# Patient Record
Sex: Male | Born: 1935 | Race: White | Hispanic: No | Marital: Married | Smoking: Former smoker
Health system: Southern US, Community
[De-identification: ages and names within clinical notes are randomized; demographics above are authoritative.]

## PROBLEM LIST (undated history)

## (undated) DIAGNOSIS — I2510 Atherosclerotic heart disease of native coronary artery without angina pectoris: Secondary | ICD-10-CM

## (undated) DIAGNOSIS — I10 Essential (primary) hypertension: Secondary | ICD-10-CM

## (undated) DIAGNOSIS — C801 Malignant (primary) neoplasm, unspecified: Secondary | ICD-10-CM

## (undated) DIAGNOSIS — M48061 Spinal stenosis, lumbar region without neurogenic claudication: Secondary | ICD-10-CM

## (undated) DIAGNOSIS — R55 Syncope and collapse: Secondary | ICD-10-CM

## (undated) DIAGNOSIS — N200 Calculus of kidney: Secondary | ICD-10-CM

## (undated) DIAGNOSIS — M1990 Unspecified osteoarthritis, unspecified site: Secondary | ICD-10-CM

## (undated) HISTORY — DX: Atherosclerotic heart disease of native coronary artery without angina pectoris: I25.10

## (undated) HISTORY — DX: Spinal stenosis, lumbar region without neurogenic claudication: M48.061

## (undated) HISTORY — PX: BACK SURGERY: SHX140

## (undated) HISTORY — DX: Syncope and collapse: R55

## (undated) HISTORY — PX: CATARACT EXTRACTION: SUR2

## (undated) HISTORY — PX: CORONARY ARTERY BYPASS GRAFT: SHX141

---

## 2000-11-28 ENCOUNTER — Inpatient Hospital Stay (HOSPITAL_COMMUNITY): Admission: EM | Admit: 2000-11-28 | Discharge: 2000-12-01 | Payer: Self-pay | Admitting: *Deleted

## 2001-10-01 ENCOUNTER — Ambulatory Visit (HOSPITAL_COMMUNITY): Admission: RE | Admit: 2001-10-01 | Discharge: 2001-10-01 | Payer: Self-pay | Admitting: Cardiology

## 2001-10-01 ENCOUNTER — Encounter: Payer: Self-pay | Admitting: Cardiology

## 2001-10-01 ENCOUNTER — Ambulatory Visit (HOSPITAL_COMMUNITY): Admission: RE | Admit: 2001-10-01 | Discharge: 2001-10-01 | Payer: Self-pay | Admitting: Pulmonary Disease

## 2001-10-02 ENCOUNTER — Inpatient Hospital Stay (HOSPITAL_COMMUNITY): Admission: RE | Admit: 2001-10-02 | Discharge: 2001-10-13 | Payer: Self-pay | Admitting: Cardiology

## 2001-10-07 ENCOUNTER — Encounter: Payer: Self-pay | Admitting: Cardiology

## 2001-10-09 ENCOUNTER — Encounter: Payer: Self-pay | Admitting: Thoracic Surgery (Cardiothoracic Vascular Surgery)

## 2001-10-09 HISTORY — PX: OTHER SURGICAL HISTORY: SHX169

## 2001-10-10 ENCOUNTER — Encounter: Payer: Self-pay | Admitting: Thoracic Surgery (Cardiothoracic Vascular Surgery)

## 2001-10-11 ENCOUNTER — Encounter: Payer: Self-pay | Admitting: Thoracic Surgery (Cardiothoracic Vascular Surgery)

## 2001-10-26 ENCOUNTER — Ambulatory Visit (HOSPITAL_COMMUNITY): Admission: RE | Admit: 2001-10-26 | Discharge: 2001-10-26 | Payer: Self-pay | Admitting: Cardiology

## 2002-02-17 ENCOUNTER — Emergency Department (HOSPITAL_COMMUNITY): Admission: EM | Admit: 2002-02-17 | Discharge: 2002-02-17 | Payer: Self-pay | Admitting: Emergency Medicine

## 2002-02-17 ENCOUNTER — Encounter: Payer: Self-pay | Admitting: Emergency Medicine

## 2002-02-17 ENCOUNTER — Inpatient Hospital Stay (HOSPITAL_COMMUNITY): Admission: AD | Admit: 2002-02-17 | Discharge: 2002-02-21 | Payer: Self-pay | Admitting: Cardiology

## 2002-02-17 ENCOUNTER — Encounter: Payer: Self-pay | Admitting: Cardiology

## 2002-02-18 ENCOUNTER — Encounter: Payer: Self-pay | Admitting: Internal Medicine

## 2002-02-20 ENCOUNTER — Encounter: Payer: Self-pay | Admitting: Cardiology

## 2002-03-21 ENCOUNTER — Ambulatory Visit (HOSPITAL_COMMUNITY): Admission: RE | Admit: 2002-03-21 | Discharge: 2002-03-21 | Payer: Self-pay | Admitting: Internal Medicine

## 2002-04-02 ENCOUNTER — Inpatient Hospital Stay (HOSPITAL_COMMUNITY): Admission: AD | Admit: 2002-04-02 | Discharge: 2002-04-10 | Payer: Self-pay | Admitting: General Surgery

## 2002-04-02 ENCOUNTER — Encounter: Payer: Self-pay | Admitting: General Surgery

## 2002-04-03 ENCOUNTER — Encounter: Payer: Self-pay | Admitting: General Surgery

## 2002-04-03 HISTORY — PX: HEMICOLECTOMY: SHX854

## 2002-04-06 ENCOUNTER — Encounter: Payer: Self-pay | Admitting: General Surgery

## 2002-09-10 ENCOUNTER — Encounter (HOSPITAL_COMMUNITY): Admission: RE | Admit: 2002-09-10 | Discharge: 2002-10-10 | Payer: Self-pay | Admitting: Oncology

## 2002-09-10 ENCOUNTER — Encounter: Admission: RE | Admit: 2002-09-10 | Discharge: 2002-09-10 | Payer: Self-pay | Admitting: Oncology

## 2002-09-11 ENCOUNTER — Encounter (HOSPITAL_COMMUNITY): Payer: Self-pay | Admitting: Oncology

## 2002-09-23 ENCOUNTER — Other Ambulatory Visit: Admission: RE | Admit: 2002-09-23 | Discharge: 2002-09-23 | Payer: Self-pay | Admitting: Dermatology

## 2003-01-20 ENCOUNTER — Emergency Department (HOSPITAL_COMMUNITY): Admission: EM | Admit: 2003-01-20 | Discharge: 2003-01-20 | Payer: Self-pay | Admitting: Emergency Medicine

## 2003-01-20 ENCOUNTER — Encounter: Payer: Self-pay | Admitting: Emergency Medicine

## 2003-06-18 ENCOUNTER — Ambulatory Visit (HOSPITAL_COMMUNITY): Admission: RE | Admit: 2003-06-18 | Discharge: 2003-06-18 | Payer: Self-pay | Admitting: Internal Medicine

## 2003-07-04 ENCOUNTER — Emergency Department (HOSPITAL_COMMUNITY): Admission: EM | Admit: 2003-07-04 | Discharge: 2003-07-04 | Payer: Self-pay | Admitting: Emergency Medicine

## 2003-07-09 ENCOUNTER — Inpatient Hospital Stay (HOSPITAL_COMMUNITY): Admission: EM | Admit: 2003-07-09 | Discharge: 2003-07-12 | Payer: Self-pay | Admitting: Emergency Medicine

## 2003-09-09 ENCOUNTER — Ambulatory Visit (HOSPITAL_COMMUNITY): Admission: RE | Admit: 2003-09-09 | Discharge: 2003-09-09 | Payer: Self-pay | Admitting: Pulmonary Disease

## 2003-11-07 ENCOUNTER — Ambulatory Visit (HOSPITAL_COMMUNITY): Admission: RE | Admit: 2003-11-07 | Discharge: 2003-11-07 | Payer: Self-pay | Admitting: Oncology

## 2004-03-12 ENCOUNTER — Ambulatory Visit (HOSPITAL_COMMUNITY): Admission: RE | Admit: 2004-03-12 | Discharge: 2004-03-12 | Payer: Self-pay | Admitting: Orthopedic Surgery

## 2004-04-01 ENCOUNTER — Encounter: Admission: RE | Admit: 2004-04-01 | Discharge: 2004-04-01 | Payer: Self-pay | Admitting: Orthopedic Surgery

## 2004-04-27 ENCOUNTER — Encounter: Admission: RE | Admit: 2004-04-27 | Discharge: 2004-04-27 | Payer: Self-pay | Admitting: Orthopedic Surgery

## 2004-05-14 ENCOUNTER — Encounter: Admission: RE | Admit: 2004-05-14 | Discharge: 2004-05-14 | Payer: Self-pay | Admitting: Orthopedic Surgery

## 2004-07-07 ENCOUNTER — Ambulatory Visit: Payer: Self-pay | Admitting: *Deleted

## 2004-07-07 ENCOUNTER — Ambulatory Visit (HOSPITAL_COMMUNITY): Admission: RE | Admit: 2004-07-07 | Discharge: 2004-07-07 | Payer: Self-pay | Admitting: *Deleted

## 2004-08-09 ENCOUNTER — Inpatient Hospital Stay (HOSPITAL_COMMUNITY): Admission: RE | Admit: 2004-08-09 | Discharge: 2004-08-13 | Payer: Self-pay | Admitting: Neurosurgery

## 2004-09-23 ENCOUNTER — Encounter: Admission: RE | Admit: 2004-09-23 | Discharge: 2004-09-23 | Payer: Self-pay | Admitting: Neurosurgery

## 2004-11-23 ENCOUNTER — Encounter: Admission: RE | Admit: 2004-11-23 | Discharge: 2004-11-23 | Payer: Self-pay | Admitting: Neurosurgery

## 2005-03-22 ENCOUNTER — Ambulatory Visit (HOSPITAL_COMMUNITY): Admission: RE | Admit: 2005-03-22 | Discharge: 2005-03-22 | Payer: Self-pay | Admitting: Pulmonary Disease

## 2005-03-31 IMAGING — CR DG CHEST 1V PORT
1 series · 1 of 1 positions shown · non-contrast
Comparison: none

CLINICAL DATA: Shortness of breath, recent lumbar surgery. 
 PORTABLE CHEST ? SINGLE VIEW:
 An [facility] semierect portable film of the chest made 08/12/04 at 3293 hours is compared to a previous study of 08/06/04 and shows slight worsening of the right basilar atelectasis.  There is again noted the coronary artery bypass grafts and mild cardiomegaly.

[view not recorded]
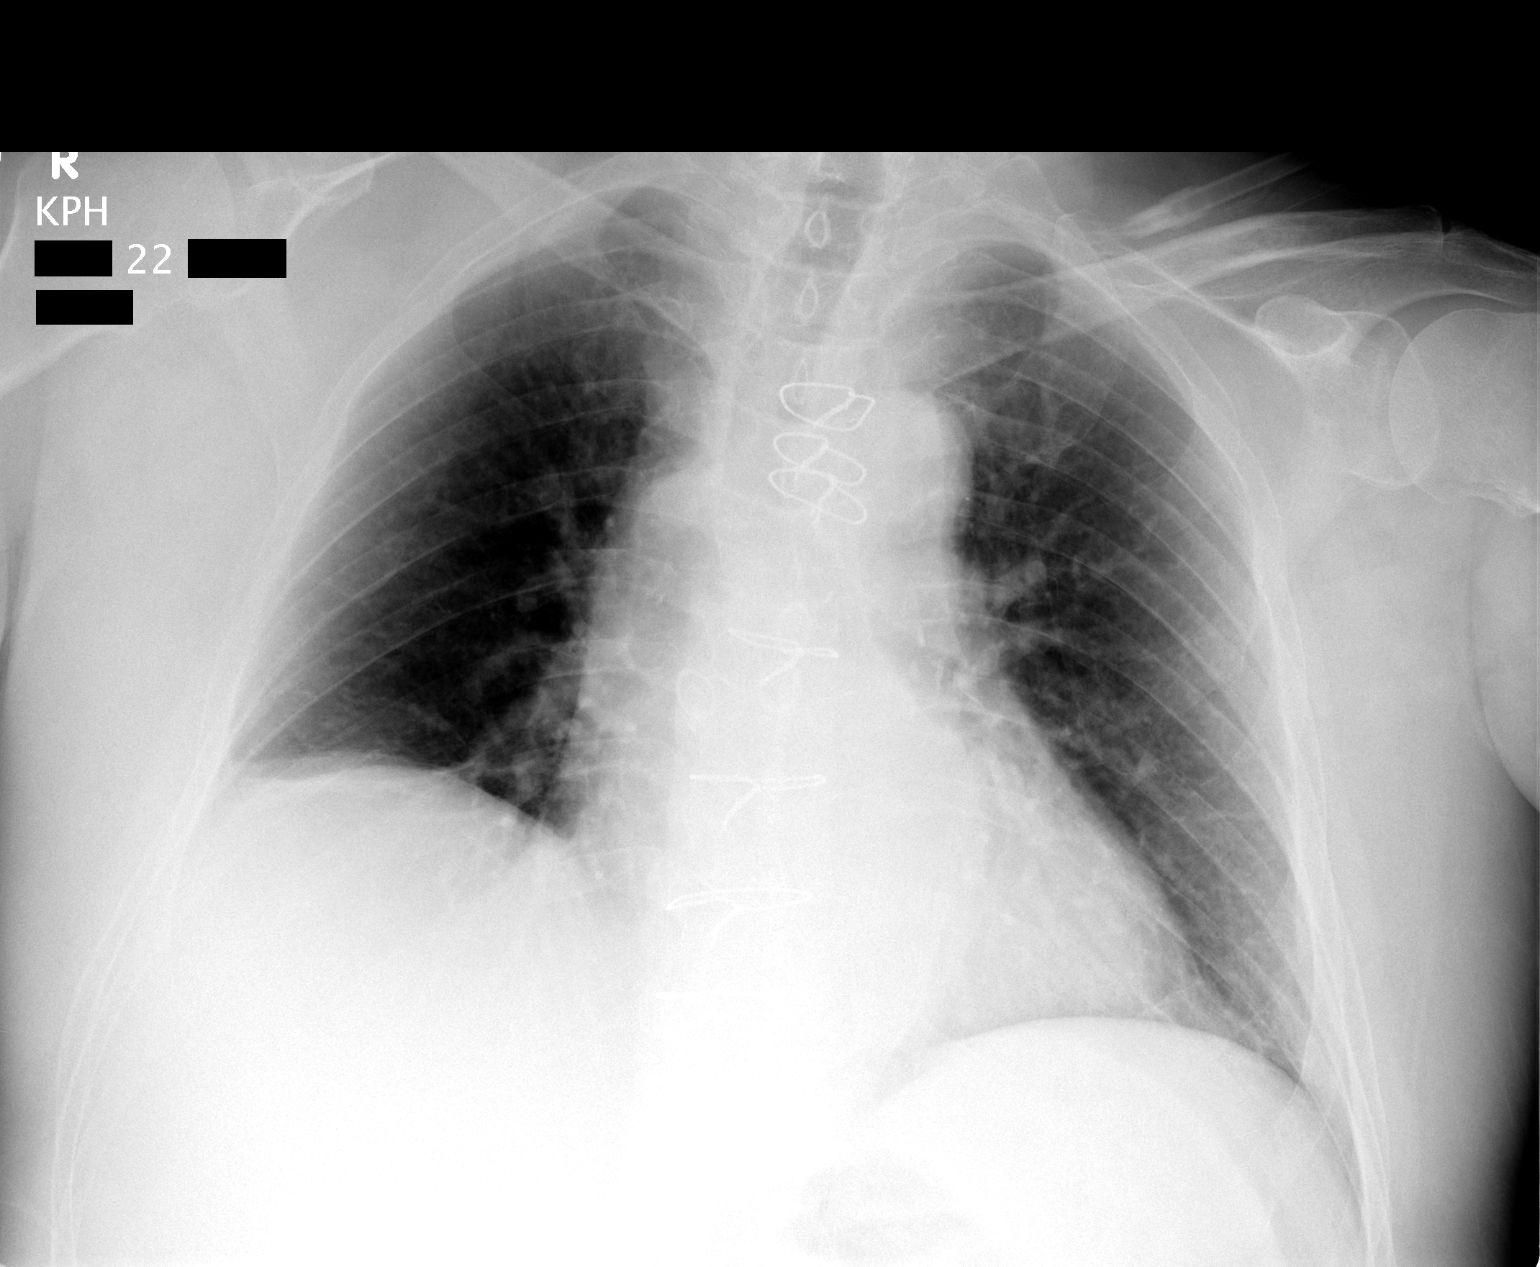

[1 of 1 positions shown; findings below may reference images not displayed]

IMPRESSION: Interval worsening right basilar atelectasis. Stable cardiomegaly, previous coronary artery bypass grafts.

## 2005-04-18 ENCOUNTER — Encounter: Admission: RE | Admit: 2005-04-18 | Discharge: 2005-05-03 | Payer: Self-pay | Admitting: Otolaryngology

## 2005-05-30 ENCOUNTER — Other Ambulatory Visit: Admission: RE | Admit: 2005-05-30 | Discharge: 2005-05-30 | Payer: Self-pay | Admitting: Dermatology

## 2005-05-31 ENCOUNTER — Encounter: Admission: RE | Admit: 2005-05-31 | Discharge: 2005-05-31 | Payer: Self-pay | Admitting: Neurosurgery

## 2006-02-14 ENCOUNTER — Ambulatory Visit: Payer: Self-pay | Admitting: Internal Medicine

## 2006-02-17 ENCOUNTER — Ambulatory Visit (HOSPITAL_COMMUNITY): Admission: RE | Admit: 2006-02-17 | Discharge: 2006-02-17 | Payer: Self-pay | Admitting: Internal Medicine

## 2006-02-17 ENCOUNTER — Ambulatory Visit: Payer: Self-pay | Admitting: Internal Medicine

## 2006-02-17 HISTORY — PX: ESOPHAGOGASTRODUODENOSCOPY: SHX1529

## 2006-03-17 ENCOUNTER — Emergency Department (HOSPITAL_COMMUNITY): Admission: EM | Admit: 2006-03-17 | Discharge: 2006-03-17 | Payer: Self-pay | Admitting: Emergency Medicine

## 2006-07-28 ENCOUNTER — Ambulatory Visit (HOSPITAL_COMMUNITY): Admission: RE | Admit: 2006-07-28 | Discharge: 2006-07-28 | Payer: Self-pay | Admitting: Urology

## 2006-11-03 IMAGING — CR DG CHEST 1V
1 series · 1 of 1 positions shown · non-contrast
Comparison: 08/12/04.

CLINICAL DATA: Chest pain and back pain.
 CHEST ? 1 VIEW ? 03/17/06 AT 4423 HOURS:

[view not recorded]
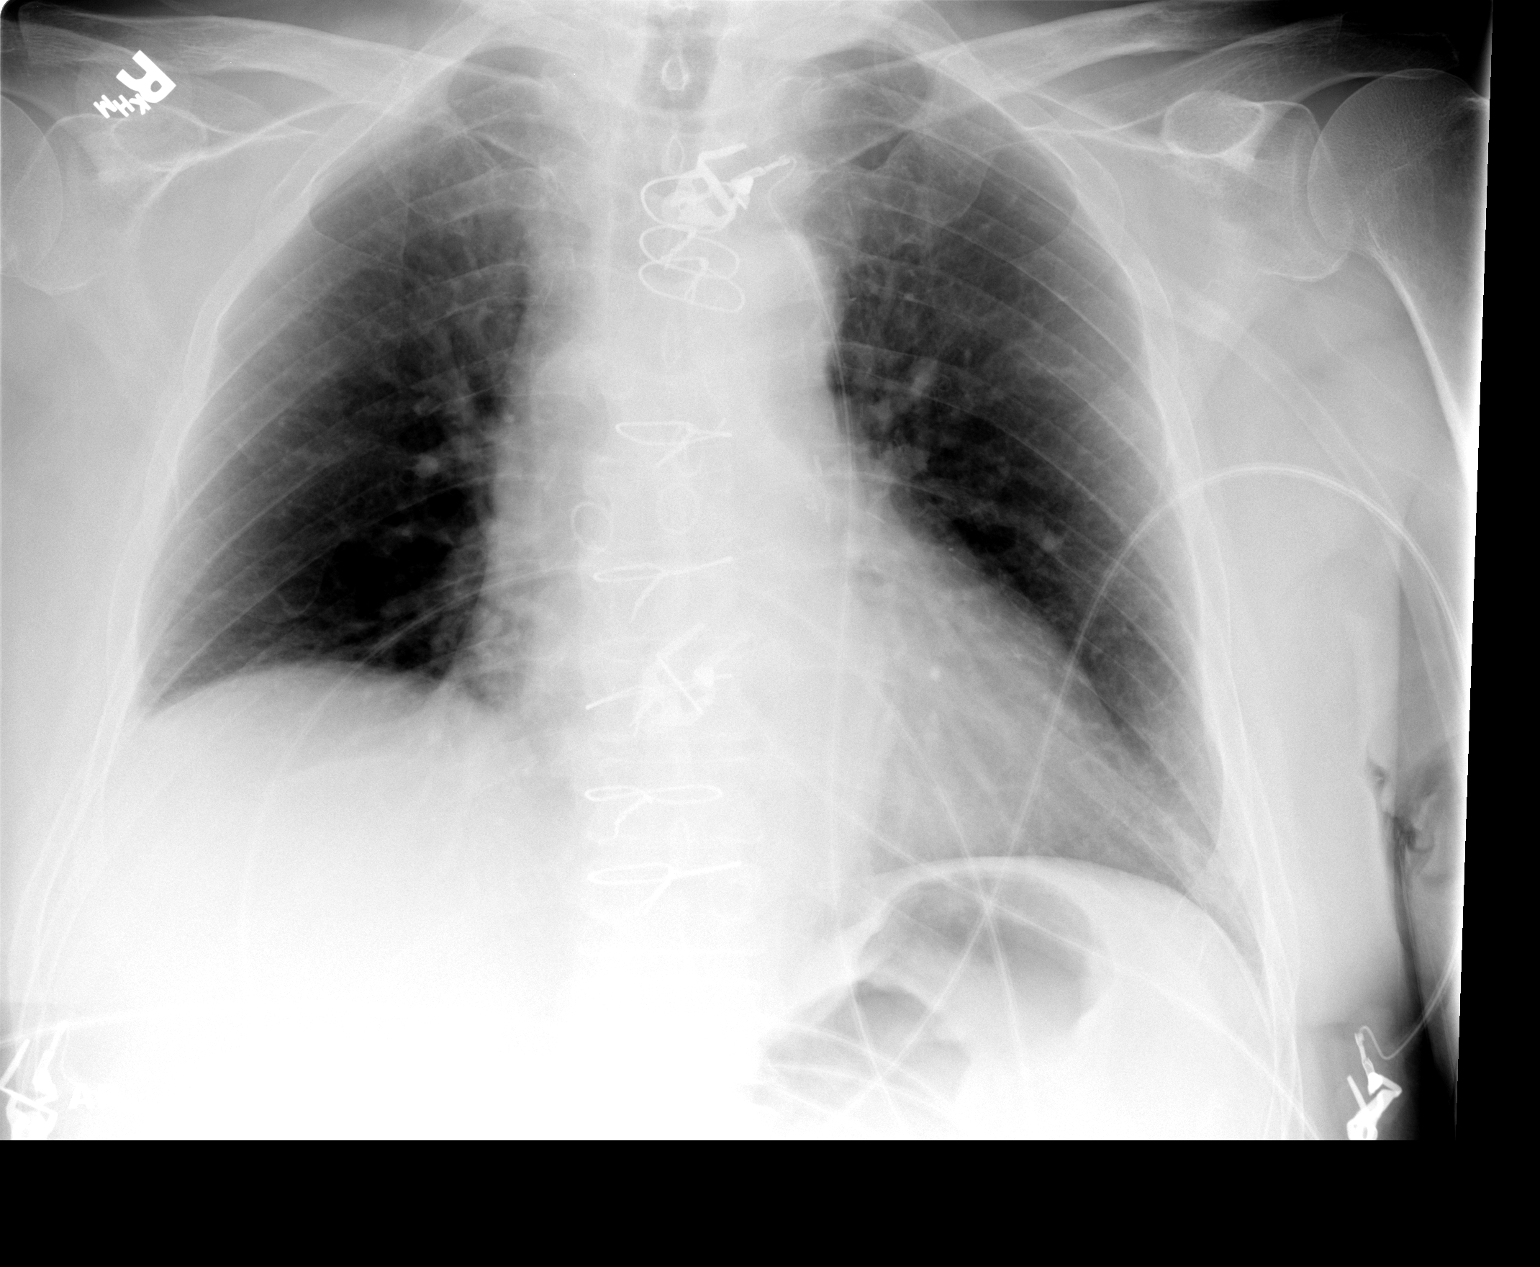

[1 of 1 positions shown; findings below may reference images not displayed]

FINDINGS: The heart is moderately enlarged.  Pulmonary vascularity is within normal limits.  The lungs are underinflated with bibasilar atelectasis.
IMPRESSION: Cardiomegaly without congestive heart failure.
 Bibasilar atelectasis.

## 2007-01-05 ENCOUNTER — Ambulatory Visit: Payer: Self-pay | Admitting: Cardiology

## 2007-01-18 ENCOUNTER — Ambulatory Visit: Payer: Self-pay

## 2007-02-09 ENCOUNTER — Ambulatory Visit: Payer: Self-pay | Admitting: Internal Medicine

## 2007-02-16 ENCOUNTER — Ambulatory Visit (HOSPITAL_COMMUNITY): Admission: RE | Admit: 2007-02-16 | Discharge: 2007-02-16 | Payer: Self-pay | Admitting: Internal Medicine

## 2007-03-01 ENCOUNTER — Ambulatory Visit (HOSPITAL_COMMUNITY): Admission: RE | Admit: 2007-03-01 | Discharge: 2007-03-01 | Payer: Self-pay | Admitting: Internal Medicine

## 2007-10-18 ENCOUNTER — Ambulatory Visit (HOSPITAL_COMMUNITY): Admission: RE | Admit: 2007-10-18 | Discharge: 2007-10-18 | Payer: Self-pay | Admitting: Neurosurgery

## 2008-01-05 ENCOUNTER — Emergency Department (HOSPITAL_COMMUNITY): Admission: EM | Admit: 2008-01-05 | Discharge: 2008-01-05 | Payer: Self-pay | Admitting: Emergency Medicine

## 2008-01-18 ENCOUNTER — Ambulatory Visit: Payer: Self-pay | Admitting: Cardiology

## 2008-07-04 ENCOUNTER — Emergency Department (HOSPITAL_COMMUNITY): Admission: EM | Admit: 2008-07-04 | Discharge: 2008-07-04 | Payer: Self-pay | Admitting: Emergency Medicine

## 2008-09-12 ENCOUNTER — Encounter
Admission: RE | Admit: 2008-09-12 | Discharge: 2008-09-12 | Payer: Self-pay | Admitting: Physical Medicine and Rehabilitation

## 2009-01-14 ENCOUNTER — Encounter (HOSPITAL_COMMUNITY)
Admission: RE | Admit: 2009-01-14 | Discharge: 2009-02-13 | Payer: Self-pay | Admitting: Physical Medicine and Rehabilitation

## 2009-01-29 DIAGNOSIS — M48061 Spinal stenosis, lumbar region without neurogenic claudication: Secondary | ICD-10-CM

## 2009-01-30 ENCOUNTER — Ambulatory Visit: Payer: Self-pay | Admitting: Cardiology

## 2009-07-29 ENCOUNTER — Ambulatory Visit (HOSPITAL_COMMUNITY): Admission: RE | Admit: 2009-07-29 | Discharge: 2009-07-29 | Payer: Self-pay | Admitting: Pulmonary Disease

## 2010-01-13 DIAGNOSIS — I2510 Atherosclerotic heart disease of native coronary artery without angina pectoris: Secondary | ICD-10-CM

## 2010-01-15 ENCOUNTER — Ambulatory Visit: Payer: Self-pay | Admitting: Cardiology

## 2010-01-19 ENCOUNTER — Encounter: Admission: RE | Admit: 2010-01-19 | Discharge: 2010-01-19 | Payer: Self-pay | Admitting: Neurosurgery

## 2010-09-21 NOTE — Assessment & Plan Note (Signed)
Summary: [name]   Visit Type:  1 yr f/u Primary Provider:  Kari Baars  CC:  chest pain.  History of Present Illness: [name] comes in today for evaluation and management of his coronary artery disease. He has had previous bypass surgery.  He's having no angina or ischemic symptoms. He denies orthopnea, palpitations his heart rate does increase when he gets excited. He is having a lot of problems with lower back and hips and is on a walker. He is not quite as humerus today.  He is compliant with his medications. Medications reviewed.  Current Medications (verified): 1)  Aspirin 81 Mg Tbec (Aspirin) .... Take One Tablet By Mouth Daily 2)  Crestor 10 Mg Tabs (Rosuvastatin Calcium) .Marland Kitchen.. 1 Tab At Bedtime 3)  Tricor 145 Mg Tabs (Fenofibrate) .Marland Kitchen.. 1 Tab Once Daily 4)  Multivitamins   Tabs (Multiple Vitamin) .Marland Kitchen.. 1 Tab Once Daily 5)  Sertraline Hcl 100 Mg Tabs (Sertraline Hcl) .Marland Kitchen.. 1 Tab Once Daily 6)  Xanax 0.5 Mg Tabs (Alprazolam) .... 3-4 Tabs Daily As Needed 7)  Metoprolol Tartrate 25 Mg Tabs (Metoprolol Tartrate) .Marland Kitchen.. 1 Tab Two Times A Day 8)  Lovaza 1 Gm Caps (Omega-3-Acid Ethyl Esters) .Marland Kitchen.. 1 Cap Four Times Daily 9)  Omeprazole 20 Mg Tbec (Omeprazole) .Marland Kitchen.. 1 Tab Once Daily 10)  Vitamin B-12 Injection .... Weekly 11)  Tramadol Hcl 50 Mg Tabs (Tramadol Hcl) .Marland Kitchen.. 1 Tab Four Times Daily 12)  Namenda 10 Mg Tabs (Memantine Hcl) .Marland Kitchen.. 1 Tab Two Times A Day 13)  Flomax 0.4 Mg Caps (Tamsulosin Hcl) .Marland Kitchen.. 1 Cap Once Daily  Allergies (verified): 1)  ! Morphine  Review of Systems       negative other than history of present illness  Vital Signs:  Patient profile:   77 year old male Height:      69 inches Weight:      200 pounds BMI:     29.64 Pulse rate:   66 / minute Pulse rhythm:   irregular BP sitting:   138 / 76  (left arm) Cuff size:   large  Vitals Entered By: Danielle Rankin, CMA (Jan 15, 2010 9:54 AM)  Physical Exam  General:  elderly, frail, in no acute  distress Head:  normocephalic and atraumatic Eyes:  PERRLA/EOM intact; conjunctiva and lids normal. Neck:  Neck supple, no JVD. No masses, thyromegaly or abnormal cervical nodes. Lungs:  Clear bilaterally to auscultation and percussion. Heart:  Non-displaced PMI, chest non-tender; regular rate and rhythm, S1, S2 without murmurs, rubs or gallops. Carotid upstroke normal, no bruit. Normal abdominal aortic size, no bruits. Femorals normal pulses, no bruits. Pedals normal pulses. No edema, no varicosities. Msk:  decreased ROM.   Pulses:  pulses normal in all 4 extremities Extremities:  trace left pedal edema and trace right pedal edema.   Neurologic:  Alert and oriented x 3. Skin:  Intact without lesions or rashes. Psych:  Normal affect.   EKG  Procedure date:  01/15/2010  Findings:      normal sinus rhythm first AV block. No acute change  Impression & Recommendations:  Problem # 1:  CAD, NATIVE VESSEL (ICD-414.01) Assessment Unchanged He is stable. Plan is medical therapy. We'll not repeat stress test. He is intolerant to adenosine. His updated medication list for this problem includes:    Aspirin 81 Mg Tbec (Aspirin) .Marland Kitchen... Take one tablet by mouth daily    Metoprolol Tartrate 25 Mg Tabs (Metoprolol tartrate) .Marland Kitchen... 1 tab two times a day  Orders: EKG w/ Interpretation (93000)  His updated medication list for this problem includes:    Aspirin 81 Mg Tbec (Aspirin) .Marland Kitchen... Take one tablet by mouth daily    Metoprolol Tartrate 25 Mg Tabs (Metoprolol tartrate) .Marland Kitchen... 1 tab two times a day  Patient Instructions: 1)  Your physician recommends that you schedule a follow-up appointment in: 1 YEAR WITH DR Corneshia Hines 2)  Your physician recommends that you continue on your current medications as directed. Please refer to the Current Medication list given to you today.

## 2010-11-05 ENCOUNTER — Emergency Department (HOSPITAL_COMMUNITY)
Admission: EM | Admit: 2010-11-05 | Discharge: 2010-11-05 | Disposition: A | Discharge status: Home / Self Care | Payer: Medicare Other | Attending: Emergency Medicine | Admitting: Emergency Medicine

## 2010-11-05 ENCOUNTER — Emergency Department (HOSPITAL_COMMUNITY): Payer: Medicare Other

## 2010-11-05 ENCOUNTER — Encounter: Payer: Self-pay | Admitting: Orthopedic Surgery

## 2010-11-05 DIAGNOSIS — Y92009 Unspecified place in unspecified non-institutional (private) residence as the place of occurrence of the external cause: Secondary | ICD-10-CM | POA: Insufficient documentation

## 2010-11-05 DIAGNOSIS — S61209A Unspecified open wound of unspecified finger without damage to nail, initial encounter: Secondary | ICD-10-CM | POA: Insufficient documentation

## 2010-11-05 DIAGNOSIS — E7800 Pure hypercholesterolemia, unspecified: Secondary | ICD-10-CM | POA: Insufficient documentation

## 2010-11-05 DIAGNOSIS — I252 Old myocardial infarction: Secondary | ICD-10-CM | POA: Insufficient documentation

## 2010-11-05 DIAGNOSIS — Z79899 Other long term (current) drug therapy: Secondary | ICD-10-CM | POA: Insufficient documentation

## 2010-11-05 DIAGNOSIS — W298XXA Contact with other powered powered hand tools and household machinery, initial encounter: Secondary | ICD-10-CM | POA: Insufficient documentation

## 2010-11-05 DIAGNOSIS — I10 Essential (primary) hypertension: Secondary | ICD-10-CM | POA: Insufficient documentation

## 2010-11-08 ENCOUNTER — Ambulatory Visit (INDEPENDENT_AMBULATORY_CARE_PROVIDER_SITE_OTHER): Payer: Medicare Other | Admitting: Orthopedic Surgery

## 2010-11-08 ENCOUNTER — Encounter: Payer: Self-pay | Admitting: Orthopedic Surgery

## 2010-11-08 DIAGNOSIS — S61209A Unspecified open wound of unspecified finger without damage to nail, initial encounter: Secondary | ICD-10-CM

## 2010-11-09 ENCOUNTER — Telehealth: Payer: Self-pay | Admitting: Orthopedic Surgery

## 2010-11-10 NOTE — Telephone Encounter (Signed)
Ok   zeroform and gauze daily until healed

## 2010-11-10 NOTE — Telephone Encounter (Signed)
Called Edwina/[facility] and advised of doctor's reply

## 2010-11-17 ENCOUNTER — Telehealth: Payer: Self-pay | Admitting: Orthopedic Surgery

## 2010-11-17 NOTE — Telephone Encounter (Signed)
Donnelle Halbig's daughter, Audie Clear left a message to cancel Serge's appointment for 11/22/10 for a recheck on his finger.  Said the home health nurse said it has healed good and he does not need to keep this appointment.

## 2010-11-18 NOTE — Assessment & Plan Note (Signed)
Summary: [facility] LACERATION RT INDEX & MIDDLE FINGER/MCR/,MUT OF OMA/BSF   Visit Type:  new patient Referring Provider:  [facility] Primary Provider:  Kari Baars  CC:  laceration to right hand.  History of Present Illness: I saw Devin Warner in the office today for an initial visit.  He is a 77 years old man with the complaint of:  Laceration to right index finger and right middle finger.  DOI 11-05-10.  Xrays were taken at [facility].  Medications to scan in chart.   RIGHT long and index finger have been injured, the index finger at the tip in the RIGHT long finger across the tip of the finger across the DIP joint crease and part of the middle phalanx. Skin and superficial abrasion type injury from a saw  Allergies: 1)  ! Morphine  Review of Systems Constitutional:  Denies weight loss, weight gain, fever, chills, and fatigue. Cardiovascular:  Denies chest pain, palpitations, fainting, and murmurs. Respiratory:  Denies short of breath, wheezing, couch, tightness, pain on inspiration, and snoring . Gastrointestinal:  Denies heartburn, nausea, vomiting, diarrhea, constipation, and blood in your stools. Genitourinary:  Denies frequency, urgency, difficulty urinating, painful urination, flank pain, and bleeding in urine. Neurologic:  Denies numbness, tingling, unsteady gait, dizziness, tremors, and seizure. Musculoskeletal:  Denies joint pain, swelling, instability, stiffness, redness, heat, and muscle pain. Endocrine:  Denies excessive thirst, exessive urination, and heat or cold intolerance. Psychiatric:  Complains of depression; denies nervousness, anxiety, and hallucinations. Skin:  Complains of redness; denies changes in the skin, poor healing, rash, and itching. HEENT:  Denies blurred or double vision, eye pain, redness, and watering. Immunology:  Denies seasonal allergies, sinus problems, and allergic to bee stings. Hemoatologic:  Denies easy bleeding and brusing.  Physical  Exam  Additional Exam:  his overall appearance is normal. He is oriented x3. His mood and affect are normal. He ambulates with assistive device, cane, well.  His RIGHT index finger and long finger have been injured. The superficial portion of the skin epidermal layer has been degloved from the tip of the fe sggle the end of his fingertip and his FDP and FDS appeared to be intact, but will need a reevaluation. Joints are stable skin as stated. Perfusion is excellent. Sensation is normal.  The index finger, distal tip, including portion of the nail has been removed by the saw as well.   Impression & Recommendations:  Problem # 1:  WOUND, FINGER (ICD-883.0) Assessment New  Orders: Wound Care Center Referral (Wound Care) New Patient Level II (54098)  Patient Instructions: 1)  Keep clean and dry 2)  go to [facility] for wound care every other day for the fingers 3)  come back 11/18/10 end of day appt   Orders Added: 1)  Wound Care Center Referral [Wound Care] 2)  New Patient Level II [11914]

## 2010-11-18 NOTE — Letter (Signed)
Summary: History form  History form   Imported By: Jacklynn Ganong 11/09/2010 12:36:50  _____________________________________________________________________  External Attachment:    Type:   Image     Comment:   External Document

## 2010-11-22 ENCOUNTER — Ambulatory Visit: Payer: Medicare Other | Admitting: Orthopedic Surgery

## 2011-01-04 NOTE — Assessment & Plan Note (Signed)
[facility]                            CARDIOLOGY OFFICE NOTE   NAME:Lienhard, SUMEET GETER                        MRN:          [id number]  DATE:01/05/2007                            DOB:          1926-09-15    Mr. Javier Gell is a delightful, humorous, 77 year old gentleman who  comes with his daughter today for further evaluation of chest discomfort  and previous coronary bypass surgery.   The last time I saw Mr. Knittle was in 2004.   He last saw Dr. Dionicio Stall in November 2005. He had had an adenosine  stress Myoview or Cardiolite in 2005 which showed no ischemia and normal  ejection fraction, he had an adverse reaction to adenosine which felt  like it was going to kill him and hurting in his chest even after he  went home. He does not want this again. Unfortunately he is unable to  ambulate.   He had coronary bypass surgery in 2003. He has normal left ventricular  function.   His other problems include hypertension and hyperlipidemia. He is  followed by Dr. Juanetta Gosling.   PAST MEDICAL HISTORY:  He is intolerant of MORPHINE. He does not do well  with adenosine as mentioned above.   CURRENT MEDICATIONS:  1. Avapro 150 mg a day.  2. AcipHex 20 mg a day.  3. Aspirin 81 mg a day.  4. Crestor 5 mg a day.  5. Tricor 160 mg a day.  6. Toprol XL 75 mg a day.  7. Xanax 5 mg a day.  8. Multivitamin daily.   PAST SURGICAL HISTORY:  He had back surgery in 1979 and 2005, had  partial colectomy in 2003 for cancer. He has had a cholecystectomy in  1983.   FAMILY HISTORY:  Remarkable for heart attacks and strokes but both his  father and his sister were above 20.   SOCIAL HISTORY:  He is retired and disabled. He is married and has 3  children.   REVIEW OF SYSTEMS:  Other than the HPI is remarkable for some shortness  of breath and dyspnea on exertion. He was a former smoker, quit in 1978.  He also gives a history of trouble swallowing sometimes with  pressure in  his chest. He said he has had his esophagus looked at several times. Dr.  Karilyn Cota in [place] is his gastroenterologist. He has also had some  urinary issues, arthritis and a history of cancer.   PHYSICAL EXAMINATION:  VITAL SIGNS:  His blood pressure is 114/72 in the  left arm, 115/70 in the right arm, pulse 66 and regular.  GENERAL:  He is 5 foot 9 and weighs 214 pounds.  VITAL SIGNS:  Heart rate is 66 and he is in sinus rhythm. His  electrocardiogram shows a first degree AV block, no ST segment changes.  HEENT:  Normocephalic, atraumatic. PERRLA. Extraocular movements intact.  Sclera clear. Facial symmetry is normal.  NECK:  Supple. Carotid upstrokes are equal bilaterally without bruits.  There is no JVD. Thyroid is not enlarged, trachea is midline.  LUNGS:  Clear.  HEART:  Reveals a poorly appreciated PMI. He has normal S1, S2 without  murmurs, rubs or gallops.  ABDOMEN:  Protuberant with good bowel sounds. He has a midline scar.  There is no tenderness.  EXTREMITIES:  Reveal trace edema. Pulses are intact. He had some  varicose veins with some palpable valves. There is no sign of DVT.  NEUROLOGIC:  Grossly intact. He walks with a cane.   ASSESSMENT/PLAN:  Mr. Retz is status post coronary blood pressure  surgery. He is over 3 years out from an objective assessment of his  coronary perfusion. His chest discomfort is a little bit atypical for  ischemic pain, in fact it sounds like more like it is esophageal.   PLAN:  A dobutamine rest stress Myoview here in [place]. He and I  feel more comfortable it was done here. If this is negative for  ischemia, I would recommend a GI followup with Dr. Karilyn Cota. I will see  him back in a year.     Thomas C. Daleen Squibb, MD, [facility]  Electronically Signed    TCW/MedQ  DD: 01/05/2007  DT: 01/05/2007  Job #: [id number]   cc:   Ramon Dredge L. Juanetta Gosling, M.D.

## 2011-01-04 NOTE — Assessment & Plan Note (Signed)
NAME:  Devin Warner, Devin Warner                  CHART#:  [id number]   DATE:  02/09/2007                       DOB:  08-Jul-1927   CHIEF COMPLAINT:  Dysphagia.   SUBJECTIVE:  The patient is a 77 year old, Caucasian male, who has a  history of recurrent dysphagia.  He last underwent an EGD by Dr. Karilyn Cota  on February 17, 2006.  He was found to have a normal EGD, a tiny  hyperplastic polyp in the stomach, which was left alone.  The esophagus  was empirically dilated with a #56-French Maloney dilator.  He also had  a sigmoid colon diverticulosis, and an otherwise normal colonoscopy with  a patent ileocolonic anastomosis.  He is status post right hemicolectomy  for colon carcinoma in August of 2003.  He tells me he is on Aciphex 20  mg twice daily.  He does continue to have breakthrough of his heartburn  and indigestion as well as severe water brash.  His weight has remained  stable.  He complains of a left anterior chest pain with swallowing.  He  was seen by a cardiologist and had a stress test and cardiac workup,  which were normal.  He tells me he has to swallow three to four times to  get his pills down, feels like things get stuck in his mid esophagus.  He has to take small sips of liquids or otherwise he gets choked up.  He  does complain of occasional odynophagia and a squeezing-type sensation  in his throat. Denies any regurgitation and denies any anorexia or early  satiety.   CURRENT MEDICATIONS:  See the list from February 09, 2007.   ALLERGIES:  MORPHINE.   OBJECTIVE:  VITAL SIGNS:  Weight 215.5 pounds, height 69 inches, temp  97.6, blood pressure 110/70, and pulse 72.  GENERAL:  The patient is a well-developed, well-nourished, elderly,  Caucasian male in no acute distress.  HEENT:  Sclerae are clear and nonicteric, conjunctivae pink.  Oropharynx  pink and moist without any lesions.  NECK:  Supple without any thyromegaly.  CHEST:  Heart regular rate and rhythm, normal S1 and S2, without  murmurs, clicks, rubs, or gallops.  LUNGS:  Clear to auscultation bilaterally with decreased breath sounds  bilaterally, no adventitious changes.  ABDOMEN:  Positive bowel sounds x4, no bruits auscultated, soft,  nontender, nondistended, without palpable mass or hepatosplenomegaly,  rebound tenderness or guarding.  EXTREMITIES:  He does have clubbing, he also has dried yellow nails with  chipping   ASSESSMENT:  The patient is a 77 year old, Caucasian male with recurrent  dysphagia despite at least two normal esophagogastroduodenoscopies.  I  suspect we could be dealing with oropharyngeal dysphagia.  He has also  been noticing chest pain with swallowing and some refractory  gastroesophageal reflux disease-type symptoms despite being on a daily  proton pump inhibitor.  I feel we should pursue a barium pill esophagram  at this point to rule out oropharyngeal dysphagia, achalasia, etc.   PLAN:  1. Barium pill esophagram.  2. He is to continue Aciphex 20 mg b.i.d. for now.  3. Further recommendations to follow, and we may need to pursue EGD      with esophageal dilatation.  He is wanting to follow up with Dr.      Jena Gauss in Dr. Patty Sermons absence.  Lorenza Burton, N.P.  Electronically Signed     R. Roetta Sessions, M.D.  Electronically Signed    KJ/MEDQ  D:  02/09/2007  T:  02/09/2007  Job:  [id number]   cc:   Ramon Dredge L. Juanetta Gosling, M.D.

## 2011-01-04 NOTE — H&P (Signed)
NAME:  Devin Warner, Devin Warner NO.:  [id number]   MEDICAL RECORD NO.:  [id number]          PATIENT TYPE:  EMS   LOCATION:  ED                            FACILITY:  [facility]   PHYSICIAN:  Mila Homer. Sudie Bailey, M.D.DATE OF BIRTH:  04-12-1927   DATE OF ADMISSION:  01/05/2008  DATE OF DISCHARGE:  05/16/2009LH                              HISTORY & PHYSICAL   This 77 year old was seen in the emergency room yesterday.  He had been  out in the heat and gotten out of the car and then got very dizzy, when  did so, he did not really remember anything.  Luckily, his daughter was  right next to him and was able to hold on to him until he recovered from  this.  He then came to the emergency room.   He has a history of being on many medications and being on narcotics.  Current meds are;  1. Avapro 150 mg daily.  2. Aciphex 20 mg b.i.d.  3. Aspirin 81 mg daily.  4. Crestor 5 mg daily.  5. Tricor 160 mg daily.  6. Toprol-XL 75 mg daily.  7. Xanax 0.5 mg as needed.   OBJECTIVE:  Time I saw him, he was oriented and alert.  Color was good.  He was somewhat recumbent in bed in the ER.  He was a good historian.  Daughter was with him to help fill the history.   His vital signs were stable.  His heart had a regular rhythm with rate  of 80.  The lungs were clear throughout, moving air well, but decreased  breath sounds.  The abdomen was soft without organomegaly or masses.  There was no edema of the ankles.   Admission white cell count was 4600 of which 48% neutrophils, 41 lymphs.  H&H is 12.4 and 34.8.  CMP showed glucose of 101.  UA was negative.  Chest x-ray showed signs of median sternotomy and CABG.  There was no  acute process noted.  His head CT was stable.  Chronic small vessel  white matter disease present.   ASSESSMENT:  The patient had a single episode lasting 2 minutes probably  secondary to orthostatic hypotension as he went to get up quickly to get  out of the car.  This is  also secondary to medications including beta  blockers and pain meds.   PLAN:  We discussed all this with the family.  After being seen in the  emergency room and finding that everything was stable and he was better,  they elected for him go home and get follow-up with their local doctor  outpatient.      Mila Homer. Sudie Bailey, M.D.  Electronically Signed     SDK/MEDQ  D:  01/06/2008  T:  01/07/2008  Job:  [id number]

## 2011-01-04 NOTE — Assessment & Plan Note (Signed)
[facility]                            CARDIOLOGY OFFICE NOTE   NAME:Devin Warner, Devin Warner                        MRN:          [id number]  DATE:01/18/2008                            DOB:          April 19, 1927    Mr. Grisby comes in today further manage of his coronary disease.  He  gets around on two walkers and has for a number of years.  He has a  tremendous amount of problems with his back.  He is extremely  delightful, humorous and very positive.   I last saw him Jan 05, 2007.  At that time we performed a stress Myoview  which showed no ischemia, normal left ventricular function, EF 60% and  no scar.  He has had previous bypass surgery.   He is currently on Avapro 150 mg a day, aspirin 81 mg a day, Crestor 5  mg a day, TriCor 160 mg a day, Toprol XL 75 mg a day, a multivitamin  daily, Aciphex 20 mg b.i.d., sertraline 50 mg a day, gabapentin 300 mg  q.h.s.   He did have an orthostatic syncopal event and was evaluated at [facility] earlier this month.  CT did not show any acute  abnormality, his blood work was unremarkable, all of which I have  reviewed today.   EXAM:  He is a very pleasant gentleman.  Blood pressure 110/70, his  pulse 71 and regular, his weight is 211, respiratory rate is 18,  unlabored.  He is alert and oriented x3.  HEENT:  Unchanged.  Carotids are full.  No bruits.  Thyroid is not  enlarged.  Trachea is midline.  LUNGS:  Clear.  HEART:  Reveals a regular rate and rhythm, no gallop.  ABDOMINAL EXAM:  Soft, good bowel sounds.  EXTREMITIES:  No cyanosis, clubbing, there is 1+ edema, pulses are  intact.  NEURO EXAM:  Intact.   His electrocardiogram shows sinus rhythm with a first degree AV block,  unchanged.   Mr. Studnicka is doing well.  I have made no change in his medical program.  I will plan on seeing him back in a year.     Thomas C. Daleen Squibb, MD, [facility]  Electronically Signed    TCW/MedQ  DD: 01/18/2008  DT:  01/18/2008  Job #: [id number]   cc:   Ramon Dredge L. Juanetta Gosling, M.D.

## 2011-01-07 NOTE — Procedures (Signed)
NAME:  Devin Warner, Devin Warner NO.:  [id number]   MEDICAL RECORD NO.:  [id number]                  PATIENT TYPE:  PREC   LOCATION:                                       FACILITY:   PHYSICIAN:  Pricilla Riffle, M.D.                 DATE OF BIRTH:   DATE OF PROCEDURE:  DATE OF DISCHARGE:                                  ECHOCARDIOGRAM   INDICATION:  A 77 year old with a history of coronary artery disease and  coronary artery bypass graft.   PROCEDURE:  A 2-D echocardiogram with echo Doppler.   FINDINGS:  1. The left ventricle is normal in size with an end-diastolic dimension of     43 mm.  The intraventricular septum is mildly thickened at 14 mm.     Posterior wall is mildly thickened at 12 mm.  2. The left atrium appears at the upper limits of normal.  The right atrium     and right ventricle are normal.  3. The aortic valve is mildly thickened, not stenotic.  There is no     insufficiency.  The mitral valve is grossly normal, no insufficiency.  4. The pulmonic valve is normal with no insufficiency.  5. The tricuspid valve is normal with no insufficiency.  6. Apical windows are difficult, but overall, LV systolic function appears     normal with no definitive all motion abnormalities. The LVF is     appropriately 60%.  RV systolic function is also normal.  7. No pericardial effusion is seen.      ___________________________________________                                            Pricilla Riffle, M.D.   PVR/MEDQ  D:  11/07/2003  T:  11/07/2003  Job:  [id number]   cc:   Ramon Dredge L. Juanetta Gosling, M.D.  [address]  [place]  [place] [address]  Fax: [phone]

## 2011-01-07 NOTE — Cardiovascular Report (Signed)
[place]. [facility]  Patient:    Devin Warner, Devin Warner Visit Number: [id number] MRN: [id number]          Service Type: OUT Location: RAD Attending Physician:  Mirian Mo Dictated by:   Jonelle Sidle, M.D. Proc. Date: 10/04/01 Admit Date:  10/01/2001 Discharge Date: 10/01/2001   CC:         Kari Baars, M.D., Angelita Ingles C. Wall, M.D. [facility]   Cardiac Catheterization  DATE OF BIRTH:  1927-02-13  INDICATIONS:  Mr. Gift is a 77 year old with a reported remote history of myocardial infarction in 1979 as well as history of pulmonary embolus on chronic Coumadin therapy and hypertension, who presents for evaluation of progressive exertional angina.  The patient underwent an exercise Cardiolite at [facility] prior to presentation, and this study was abnormal.  he has not ruled in for a myocardial infarction, and prior to proceeding with cardiac catheterization underwent upper endoscopy to evaluate iron-deficiency anemia.  Esophageal erosions were noted, and the patient is now referred for cardiac catheterization to clearly define the coronary anatomy.  PROCEDURES: 1. Left heart catheterization. 2. Left ventriculography. 3. Selective coronary angiography.  CARDIOLOGIST: Jonelle Sidle, M.D.  ACCESS AND EQUIPMENT:  The area about the right femoral artery was anesthetized with 1% lidocaine.  A 6-French sheath was placed in the right femoral artery via the modified Seldinger technique.  Standard preformed 6-French JL4 and JR4 catheters were used for selective coronary angiography. A 6-French angled pigtail catheter was used for left heart catheterization and left ventriculography.  All exchanges were made over a wire.  The patient tolerated the procedure well without obvious complications.  HEMODYNAMICS:  Left ventricle 127/18 mmHg (post angiography), aorta 129/71 mmHg.  ANGIOGRAPHIC FINDINGS: 1. The left main coronary  artery has a 20% proximal stenosis. 2. The left anterior descending is a medium caliber vessel that provides    three diagonal branches.  There is a 90% high mid LAD stenosis just after    the takeoff of the first diagonal branch.  The first diagonal branch    proper has a 70% proximal stenosis.  The second diagonal branch is a    small vessel with a 90% ostial stenosis.  There is approximately 50%    diffuse stenosis involving the distal LAD. 3. The circumflex coronary artery provides three obtuse marginal branches,    the first of which is essentially a ramus intermedius.  There is a 70%    mid circumflex stenosis, 40% proximal stenosis involving the second obtuse    marginal branch, and an 80% more distal circumflex stenosis. 4. The right coronary artery is a dominant vessel that provides a    posterior descending branch.  There are tandem 30% stenoses in the proximal    segment, 50% mid and distal RCA stenoses, and a 70% proximal    posterior descending branch stenosis.  LEFT VENTRICULOGRAPHY:  Left ventriculography reveals an ejection fraction of approximately 50 to 55%.  Trace mitral regurgitation is noted.  DIAGNOSES: 1. Multivessel coronary artery disease as outlined above. 2. Left ventricular ejection fraction of 50 to 55% with trace mitral    regurgitation.  RECOMMENDATIONS:  Will ask CVTS to evaluate this patient for coronary artery bypass grafting.  Given his ongoing gastrointestinal evaluation, it would probably be prudent to proceed with lower endoscopy as a matter of complete screening prior to proceeding with surgery.  Will otherwise continue aggressive medical therapy. Dictated by:  Jonelle Sidle, M.D. Attending Physician:  Mirian Mo DD:  10/04/01 TD:  10/05/01 Job: [id number]

## 2011-01-07 NOTE — Op Note (Signed)
NAME:  Devin Warner, Devin Warner                 ACCOUNT NO.:  [id number]   MEDICAL RECORD NO.:  [id number]          PATIENT TYPE:  AMB   LOCATION:  DAY                           FACILITY:  [facility]   PHYSICIAN:  Lionel December, M.D.    DATE OF BIRTH:  03-08-1927   DATE OF PROCEDURE:  02/17/2006  DATE OF DISCHARGE:                                 OPERATIVE REPORT   PROCEDURE:  Esophagogastroduodenoscopy with esophageal dilation followed by  colonoscopy.   INDICATIONS:  Latasha is 77 year old Caucasian male who has chronic GIRD, whose  heartburn is well-controlled with therapy who is complaining of intermittent  dysphagia primarily to solids.  He is undergoing surveillance colonoscopy.  He is status post right hemicolectomy for colon carcinoma in August 2003.  He also complains of diarrhea since his surgery.  Procedures were reviewed  the patient, informed consent was obtained.   MEDS FOR CONSCIOUS SEDATION:  Benzocaine spray for pharyngeal topical  anesthesia, Demerol 50 mg IV, Versed 6 mg IV.   FINDINGS:  Procedure is performed in endoscopy suite.  The patient's vital  signs and O2 sat were monitored during procedure and remained stable.   PROCEDURE:  1.  Esophagogastroduodenoscopy.  The patient was placed in left lateral      position and Olympus videoscope was passed oropharynx without any      difficulty into esophagus.   Esophagus.  Mucosa of the esophagus normal.  GE junction was 40 cm.  There  was no ring or stricture noted.   Stomach.  It was empty and distended very well with insufflation.  Folds  proximal stomach were normal.  Examination mucosa revealed 5 mm hyperplastic  appearing polyp at antrum which was left alone.  Pyloric channel was patent.  Angularis, fundus and cardia examined by retroflexing the scope were normal.   Duodenum.  Bulbar mucosa was normal.  Scope was passed to second part of  duodenum where mucosa and folds were normal.  Endoscope was withdrawn.   Esophagus was  dilated by passing 56-French [facility] dilator full insertion.  As the dilator was withdrawn endoscope was passed again and there was no  mucosal injury noted to the esophagus.  Endoscope was withdrawn.  The  patient prepared for procedure #2.   Colonoscopy.  Rectal examination performed.  No abnormality noted on  external or digital exam.  Olympus videoscope was placed rectum and advanced  under vision into sigmoid colon beyond.  Preparation was felt to be  excellent.  Few scattered diverticula noted sigmoid colon.  Scope was passed  into the hepatic flexure where ileocolonic anastomosis identified, was wide  open.  Pictures taken for the record.  Colonic mucosa was examined for the  second time and there were no polyps or mucosal changes to suggest colitis.  Rectal mucosa similarly was normal..  Scope was retroflexed to examine  anorectal junction which was unremarkable.  Endoscope was then withdrawn.   FINAL DIAGNOSIS:  Normal esophagogastroduodenoscopy.  Tiny hyperplastic  polyp in stomach which was left alone.  Next esophagus dilated by passing 56-  Jamaica Maloney dilator to full  insertion.   Sigmoid colon diverticulosis, otherwise normal colonoscopy with patent  ileocolonic anastomosis.   Suspect his diarrhea may be due to bile induced steatorrhea.   PLAN:  To continue antireflux measures and AcipHex as before.   Loperamide 2 mg once or twice daily and Benefiber 3-4 grams daily.  If these  measures do not control his diarrhea, he will try Questran 2 grams p.o.  b.i.d. 2 hours before and after his meals, prescription given for 60 doses  with three refills.   He will need next colonoscopy in 3 years from now.      Lionel December, M.D.  Electronically Signed     NR/MEDQ  D:  02/17/2006  T:  02/17/2006  Job:  [id number]   cc:   Ramon Dredge L. Juanetta Gosling, M.D.  Fax: [phone]

## 2011-01-07 NOTE — Procedures (Signed)
[place]. [facility]  Patient:    ARVINE, CLAYBURN Visit Number: [id number] MRN: [id number]          Service Type: OUT Location: RAD Attending Physician:  Mirian Mo Dictated by:   Hedwig Morton. Juanda Chance, M.D. [facility] Admit Date:  10/01/2001 Discharge Date: 10/01/2001   CC:         Bruce R. Juanda Chance, M.D. [facility]                           Procedure Report  PROCEDURE:  Upper endoscopy.  INDICATION:  This 77 year old gentleman with coronary artery disease on chronic Coumadin therapy for a past pulmonary embolus presented with hematochezia as well as passage of dark melanic stools.  Hemoglobin dropped to less than 10 g, and his stool was Hemoccult-positive prior to the cardiac catheterization.  He is undergoing upper endoscopy to assess his GI bleeding.  ENDOSCOPE:  Fujinon single-channel endoscope.  SEDATION:  Versed 8 mg IV.  FINDINGS:  Esophagus:  The Fujinon single-channel video scope passed under direct vision through posterior pharynx into esophagus.  The patient was monitored by pulse oximeter, and his oxygen saturations were 92-95%.  He was cooperative.  Proximal, mid-, and distal esophageal mucosa was unremarkable. There were no esophageal varices or erosions.  The squamocolumnar junction was normal.  Stomach:  The stomach was insufflated with air, showed numerous coffee-grounds flecks throughout the stomach.  There were multiple pinpoint erosions throughout the proximal, mid-, and distal stomach, especially in the antrum. Irrigation of these lesions did not show any evidence of AV malformations. There was small low-grade blood loss for one of the erosions.  CLOtest was taken from the gastric antrum.  Retroflex of endoscope revealed normal fundus and cardia.  Duodenum:  Duodenal bulb showed multiple erosions with exudate and erythema as well as friability, extending to descending duodenum.  There was no dominant ulcer.  Endoscope was then retracted in  stomach, decompressed.  The patient tolerated the procedure well.  IMPRESSION:  Diffuse lesions consistent with gastritis and duodenitis with low-grade gastrointestinal blood loss but no dominant lesion, status post CLOtest.  PLAN:  The etiology of the lesions is not clear, since patient absolutely denied any use of aspirin, NSAIDs, or caffeine or alcohol.  He also does not smoke.  He apparently had similar endoscopic findings one year ago but took proton pump inhibitors only for a limited amount of time.  Now we will keep the patient on a PPI on a long-term basis and also re-evaluate his need for long-term Coumadin, since it has been 20 years since his last pulmonary embolus.  He is to proceed with cardiac evaluation, and also recommendation would be to undergo colonoscopy prior to discharge from the hospital. Dictated by:   Hedwig Morton. Juanda Chance, M.D. [facility] Attending Physician:  Mirian Mo DD:  10/03/01 TD:  10/04/01 Job: [id number]

## 2011-01-07 NOTE — Group Therapy Note (Signed)
NAME:  Devin Warner, Devin Warner                           ACCOUNT NO.:  [id number]   MEDICAL RECORD NO.:  [id number]                   PATIENT TYPE:  INP   LOCATION:  A329                                 FACILITY:  [facility]   PHYSICIAN:  Edward L. Juanetta Gosling, M.D.             DATE OF BIRTH:  November 08, 1926   DATE OF PROCEDURE:  07/12/2003  DATE OF DISCHARGE:  07/12/2003                                   PROGRESS NOTE   SUBJECTIVE:  Mr. Saefong says he is much better today.  He did get up and  move around a good bit yesterday and feels like his strength is better.   PHYSICAL EXAMINATION:  CHEST:  Clear.  HEART:  Regular.  ABDOMEN:  Soft.  VITAL SIGNS:  Blood pressure 120/70.  EXTREMITIES:  Show no edema.  CNS:  Grossly intact.   ASSESSMENT:  He is improved.   PLAN:  Continue with his treatments and medications.  No changes today, but  he wants to go home.  He rates his pain now at about a 5.  So, I think we  can go ahead with discharge today.  Please see discharge summary for  details.      ___________________________________________                                            Oneal Deputy Juanetta Gosling, M.D.   ELH/MEDQ  D:  07/12/2003  T:  07/12/2003  Job:  [id number]
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
## 2011-01-07 NOTE — Procedures (Signed)
NAME:  Devin Warner, GIRGIS NO.:  [id number]   MEDICAL RECORD NO.:  [id number]                  PATIENT TYPE:  PREC   LOCATION:                                       FACILITY:   PHYSICIAN:  Vida Roller, M.D.                DATE OF BIRTH:   DATE OF PROCEDURE:  11/07/2003  DATE OF DISCHARGE:                                    STRESS TEST   HISTORY OF PRESENT ILLNESS:  Devin Warner is a 77 year old man who has  coronary artery disease, status post bypass surgery in 2003.  This is an  assessment for ischemia.   DETAILS OF PROCEDURE:  The patient was evaluated initially here in the  cardiology section.  He had an IV placed in his left antecubital.  EKG leads  were obtained, and then the patient underwent an infusion of adenosine.  He  received approximately 59 mg over a four-minute period.  At that time, he  had continuous EKG and blood pressure assessment.  At the conclusion of the  adenosine infusion, he received six more minutes of EKG assessment, and then  was discharged.  He received his infusion of high-dose technetium 99-M  Cardiolite midway through the adenosine infusion.   RESULTS:  Mr. Epps experienced moderate chest discomfort with flushing and  shortness of breath associated with the infusion of adenosine which resolved  in recovery.  His heart rate increased from 73 beats per minute to 102 beats  per minute.  His blood pressure went 132/80 to 146/70.  During the infusion,  at about midway point, he had evidence of a mild bradycardia associated with  a right bundle branch block which resolved with the cessation of the  infusion.  He had no obvious ST-T changes concerning for ischemia. His  baseline electrocardiogram is interpretable.   ASSESSMENT:  This is a moderately abnormal adenosine infusion.  Images are  pending.      ___________________________________________                                            Vida Roller, M.D.   JH/MEDQ  D:  11/07/2003  T:  11/07/2003  Job:  [id number]

## 2011-01-07 NOTE — Discharge Summary (Signed)
NAME:  NAVARRO, NINE                 ACCOUNT NO.:  [id number]   MEDICAL RECORD NO.:  [id number]          PATIENT TYPE:  INP   LOCATION:  3007                         FACILITY:  [facility]   PHYSICIAN:  Donalee Citrin, M.D.        DATE OF BIRTH:  01-20-1927   DATE OF ADMISSION:  08/09/2004  DATE OF DISCHARGE:  08/13/2004                           DISCHARGE SUMMARY - REFERRING   ADMISSION DIAGNOSIS:  Severe lumbar spinal stenosis L4-5, L5-S1.   PROCEDURE:  Decompressive laminectomy and fusion L4-5, L5-S1.   HOSPITAL COURSE:  Patient is a very pleasant 77 year old gentleman who was  admitted as an EMA and went to the operating room and underwent the  aforementioned procedure.  Postoperatively the patient did very well, went  to the recovery room and then to the floor.  On the floor, patient afebrile  with stable vital signs.  Hemovac put out 100 mL overnight. This was able to  be discontinued two days later.  He was progressively mobilized with  physical and occupational therapy and he performed well with this.  He was  voiding spontaneously by hospital day #3 and by hospital day #5 was able to  be discharged home.   CAUSE OF DEATH:  Neurologically stable, ambulating and voiding.  Tolerating  a regular diet and pain controlled on pills.      GC/MEDQ  D:  11/17/2004  T:  11/17/2004  Job:  [id number]

## 2011-04-08 ENCOUNTER — Ambulatory Visit: Payer: Medicare Other | Admitting: Cardiology

## 2011-04-11 ENCOUNTER — Encounter: Payer: Self-pay | Admitting: Cardiology

## 2011-04-11 ENCOUNTER — Ambulatory Visit (INDEPENDENT_AMBULATORY_CARE_PROVIDER_SITE_OTHER): Payer: Medicare Other | Admitting: Cardiology

## 2011-04-11 VITALS — BP 141/76 | HR 67 | Resp 18 | Ht 72.0 in | Wt 212.1 lb

## 2011-04-11 DIAGNOSIS — I2510 Atherosclerotic heart disease of native coronary artery without angina pectoris: Secondary | ICD-10-CM

## 2011-04-11 NOTE — Assessment & Plan Note (Signed)
Stable. Continue medical therapy 

## 2011-04-11 NOTE — Progress Notes (Signed)
HPI Devin Warner returns today for the evaluation and management of his coronary artery disease. He is status post coronary artery bypass grafting 2003.  He's not had any significant angina or ischemic symptoms. He gets around with a walker and a cane. His biggest limitation is spinal stenosis and low back pain  Meds reviewed and he is compliant. EKG not repeated today. Past Medical History  Diagnosis Date  . Coronary atherosclerosis of native coronary artery   . Syncope and collapse   . Spinal stenosis, lumbar region, without neurogenic claudication     Past Surgical History  Procedure Date  . Esophagogastroduodenoscopy 02/17/2006    with esophageal dilation followed by colonscopy   . Hemicolectomy 04/03/2002  . 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 10/09/01    Surgeon Charlett Lango     Family History  Problem Relation Age of Onset  . Coronary artery disease      family history     History   Social History  . Marital Status: Married    Spouse Name: N/A    Number of Children: N/A  . Years of Education: N/A   Occupational History  . Not on file.   Social History Main Topics  . Smoking status: Never Smoker   . Smokeless tobacco: Not on file  . Alcohol Use: No  . Drug Use: No  . Sexually Active: Not on file   Other Topics Concern  . Not on file   Social History Narrative  . No narrative on file    Allergies  Allergen Reactions  . Morphine     Current Outpatient Prescriptions  Medication Sig Dispense Refill  . ALPRAZolam (XANAX) 0.5 MG tablet Take 0.5 mg by mouth daily as needed.        Marland Kitchen aspirin (ASPIRIN LOW DOSE) 81 MG EC tablet Take 81 mg by mouth daily.        . fenofibrate (TRICOR) 145 MG tablet Take 145 mg by mouth daily.        . fenofibrate micronized (LOFIBRA) 134 MG capsule       . memantine (NAMENDA) 10 MG tablet Take 10 mg by mouth 2 (two) times daily.        . methylPREDNIsolone (MEDROL DOSPACK) 4 MG tablet       . metoprolol  tartrate (LOPRESSOR) 25 MG tablet Take 25 mg by mouth 2 (two) times daily.        . Multiple Vitamin (MULTIVITAMIN) tablet Take 1 tablet by mouth daily.        Marland Kitchen omega-3 acid ethyl esters (LOVAZA) 1 G capsule Take 2 g by mouth 4 (four) times daily.        Marland Kitchen omeprazole (PRILOSEC) 20 MG capsule Take 20 mg by mouth daily.        . rosuvastatin (CRESTOR) 10 MG tablet Take 10 mg by mouth at bedtime.        . sertraline (ZOLOFT) 100 MG tablet Take 100 mg by mouth daily.        . Tamsulosin HCl (FLOMAX) 0.4 MG CAPS Take 0.4 mg by mouth daily.        . traMADol (ULTRAM) 50 MG tablet Take 50 mg by mouth daily.        . vitamin B-12 (CYANOCOBALAMIN) 1000 MCG tablet Take 1,000 mcg by mouth daily.          ROS Negative other than HPI.   PE General Appearance: well developed, well nourished in no acute distress, elderly,  ruddy complexion HEENT: symmetrical face, PERRLA, good dentition  Neck: no JVD, thyromegaly, or adenopathy, trachea midline Chest: symmetric without deformity Cardiac: PMI non-displaced, RRR, normal S1, S2, no gallop or murmur Lung: clear to ausculation and percussion Vascular: all pulses full without bruits  Abdominal: nondistended, nontender, good bowel sounds, no HSM, no bruits Extremities: no cyanosis, clubbing, minimal edema, no sign of DVT, no varicosities  Skin: normal color, no rashes Neuro: alert and oriented x 3, non-focal Pysch: normal affect Filed Vitals:   04/11/11 1615  BP: 141/76  Pulse: 67  Resp: 18  Height: 6' (1.829 m)  Weight: 212 lb 1.9 oz (96.217 kg)  SpO2: 97%    EKG  Labs and Studies Reviewed.   No results found for this basename: WBC, HGB, HCT, MCV, PLT      Chemistry   No results found for this basename: NA, K, CL, CO2, BUN, CREATININE, GLU   No results found for this basename: CALCIUM, ALKPHOS, AST, ALT, BILITOT       No results found for this basename: CHOL   No results found for this basename: HDL   No results found for this  basename: LDLCALC   No results found for this basename: TRIG   No results found for this basename: CHOLHDL   No results found for this basename: HGBA1C   No results found for this basename: ALT, AST, GGT, ALKPHOS, BILITOT   No results found for this basename: TSH

## 2011-04-11 NOTE — Patient Instructions (Signed)
**Note De-identified  Obfuscation** Your physician recommends that you continue on your current medications as directed. Please refer to the Current Medication list given to you today.  Your physician recommends that you schedule a follow-up appointment in: 1 year  

## 2011-04-22 ENCOUNTER — Other Ambulatory Visit (HOSPITAL_COMMUNITY): Payer: Self-pay | Admitting: Pulmonary Disease

## 2011-04-22 ENCOUNTER — Ambulatory Visit (HOSPITAL_COMMUNITY)
Admission: RE | Admit: 2011-04-22 | Discharge: 2011-04-22 | Disposition: A | Discharge status: Home / Self Care | Payer: Medicare Other | Source: Ambulatory Visit | Attending: Pulmonary Disease | Admitting: Pulmonary Disease

## 2011-04-22 DIAGNOSIS — M898X1 Other specified disorders of bone, shoulder: Secondary | ICD-10-CM

## 2011-04-22 DIAGNOSIS — M949 Disorder of cartilage, unspecified: Secondary | ICD-10-CM | POA: Insufficient documentation

## 2011-04-22 DIAGNOSIS — M25519 Pain in unspecified shoulder: Secondary | ICD-10-CM | POA: Insufficient documentation

## 2011-04-22 DIAGNOSIS — M899 Disorder of bone, unspecified: Secondary | ICD-10-CM | POA: Insufficient documentation

## 2011-05-18 LAB — COMPREHENSIVE METABOLIC PANEL
ALT: 18
AST: 22
Albumin: 3.5
Alkaline Phosphatase: 25 — ABNORMAL LOW
CO2: 27
Chloride: 105
Creatinine, Ser: 1.45
GFR calc Af Amer: 57 — ABNORMAL LOW
GFR calc non Af Amer: 47 — ABNORMAL LOW
Potassium: 4.4
Sodium: 135
Total Bilirubin: 1.1

## 2011-05-18 LAB — CBC
MCV: 93.3
RBC: 3.73 — ABNORMAL LOW
WBC: 4.6

## 2011-05-18 LAB — URINALYSIS, ROUTINE W REFLEX MICROSCOPIC
Bilirubin Urine: NEGATIVE
Ketones, ur: NEGATIVE
Nitrite: NEGATIVE
Protein, ur: NEGATIVE

## 2011-05-18 LAB — URINE CULTURE
Colony Count: NO GROWTH
Culture: NO GROWTH

## 2011-05-18 LAB — DIFFERENTIAL
Basophils Absolute: 0
Eosinophils Absolute: 0.2
Eosinophils Relative: 4
Monocytes Absolute: 0.3

## 2011-05-24 LAB — URINALYSIS, ROUTINE W REFLEX MICROSCOPIC
Leukocytes, UA: NEGATIVE
Nitrite: NEGATIVE
Specific Gravity, Urine: >1.03 — ABNORMAL HIGH
Urobilinogen, UA: 0.2
pH: 5

## 2011-05-24 LAB — CBC
Hemoglobin: 11.8 — ABNORMAL LOW
MCHC: 33.4
MCV: 93
RBC: 3.82 — ABNORMAL LOW
RDW: 14.3

## 2011-05-24 LAB — POCT CARDIAC MARKERS
CKMB, poc: <1 — ABNORMAL LOW
Troponin i, poc: <0.05

## 2011-05-24 LAB — COMPREHENSIVE METABOLIC PANEL
BUN: 23
CO2: 29
Calcium: 10
Creatinine, Ser: 1.49
GFR calc non Af Amer: 45 — ABNORMAL LOW
Glucose, Bld: 111 — ABNORMAL HIGH
Total Protein: 5.9 — ABNORMAL LOW

## 2011-05-24 LAB — LIPASE, BLOOD: Lipase: 19

## 2011-05-24 LAB — TSH: TSH: 2.806

## 2011-05-24 LAB — DIFFERENTIAL
Eosinophils Absolute: 0.1
Lymphocytes Relative: 22
Lymphs Abs: 1.2
Monocytes Relative: 5
Neutro Abs: 3.9
Neutrophils Relative %: 71

## 2011-05-24 LAB — T4, FREE: Free T4: 1.27

## 2011-05-24 LAB — URINE MICROSCOPIC-ADD ON

## 2012-04-10 ENCOUNTER — Encounter: Payer: Self-pay | Admitting: Cardiology

## 2012-04-10 ENCOUNTER — Ambulatory Visit (INDEPENDENT_AMBULATORY_CARE_PROVIDER_SITE_OTHER): Payer: Medicare Other | Admitting: Cardiology

## 2012-04-10 VITALS — BP 94/60 | HR 74 | Ht 68.0 in | Wt 204.0 lb

## 2012-04-10 DIAGNOSIS — I2510 Atherosclerotic heart disease of native coronary artery without angina pectoris: Secondary | ICD-10-CM

## 2012-04-10 NOTE — Assessment & Plan Note (Signed)
Stable. Continue secondary preventative therapy. Return to the office in one year. 

## 2012-04-10 NOTE — Progress Notes (Signed)
HPI Devin Warner comes in today for evaluation and management his coronary artery disease. He continues to do remarkably well and is fairly independent. He had a very good garden this year.  Denies any chest pain, palpitations, presyncope or syncope. Blood pressure is on the low side today but he is only on Lopressor 25 twice a day. He drinks plenty water and stays well hydrated.    Past Medical History  Diagnosis Date  . Coronary atherosclerosis of native coronary artery   . Syncope and collapse   . Spinal stenosis, lumbar region, without neurogenic claudication     Current Outpatient Prescriptions  Medication Sig Dispense Refill  . ALPRAZolam (XANAX) 0.5 MG tablet Take 0.5 mg by mouth daily as needed.        Marland Kitchen aspirin (ASPIRIN LOW DOSE) 81 MG EC tablet Take 81 mg by mouth daily.        . fenofibrate micronized (LOFIBRA) 134 MG capsule Take 134 mg by mouth daily.       . memantine (NAMENDA) 10 MG tablet Take 10 mg by mouth 2 (two) times daily.        . metoprolol tartrate (LOPRESSOR) 25 MG tablet Take 25 mg by mouth 2 (two) times daily.        . Multiple Vitamin (MULTIVITAMIN) tablet Take 1 tablet by mouth daily.        Marland Kitchen omega-3 acid ethyl esters (LOVAZA) 1 G capsule Take 2 g by mouth 4 (four) times daily.        Marland Kitchen omeprazole (PRILOSEC) 20 MG capsule Take 20 mg by mouth daily.        . rosuvastatin (CRESTOR) 10 MG tablet Take 10 mg by mouth at bedtime.        . sertraline (ZOLOFT) 100 MG tablet Take 100 mg by mouth daily.        . Tamsulosin HCl (FLOMAX) 0.4 MG CAPS Take 0.4 mg by mouth daily.        . traMADol (ULTRAM) 50 MG tablet Take 50 mg by mouth daily.        . vitamin B-12 (CYANOCOBALAMIN) 1000 MCG tablet Take 2,000 mcg by mouth daily.         Allergies  Allergen Reactions  . Morphine Hives, Anxiety and Rash    PT STATES MORPHINE MAKES HIM "WILD"    Family History  Problem Relation Age of Onset  . Coronary artery disease      family history     History   Social  History  . Marital Status: Married    Spouse Name: N/A    Number of Children: N/A  . Years of Education: N/A   Occupational History  . Not on file.   Social History Main Topics  . Smoking status: Never Smoker   . Smokeless tobacco: Not on file  . Alcohol Use: No  . Drug Use: No  . Sexually Active: Not on file   Other Topics Concern  . Not on file   Social History Narrative  . No narrative on file    ROS ALL NEGATIVE EXCEPT THOSE NOTED IN HPI  PE  General Appearance: well developed, well nourished in no acute distress, elderly, rate sensing or HEENT: symmetrical face, PERRLA, fair dentition Neck: no JVD, thyromegaly, or adenopathy, trachea midline Chest: symmetric without deformity Cardiac: PMI non-displaced, RRR, normal S1, S2, no gallop or murmur Lung: clear to ausculation and percussion Vascular: all pulses full without bruits  Abdominal: nondistended, nontender, good bowel  sounds, no HSM, no bruits Extremities: no cyanosis, clubbing or edema, no sign of DVT, no varicosities  Skin: normal color, no rashes Neuro: alert and oriented x 3, non-focal Pysch: normal affect  EKG Normal sinus rhythm, normal EKG BMET    Component Value Date/Time   NA 136 07/04/2008 1055   K 3.8 07/04/2008 1055   CL 103 07/04/2008 1055   CO2 29 07/04/2008 1055   GLUCOSE 111* 07/04/2008 1055   BUN 23 07/04/2008 1055   CREATININE 1.49 07/04/2008 1055   CALCIUM 10.0 07/04/2008 1055   GFRNONAA 45* 07/04/2008 1055   GFRAA  Value: 55        The eGFR has been calculated using the MDRD equation. This calculation has not been validated in all clinical* 07/04/2008 1055    Lipid Panel  No results found for this basename: chol, trig, hdl, cholhdl, vldl, ldlcalc    CBC    Component Value Date/Time   WBC 5.5 07/04/2008 1055   RBC 3.82* 07/04/2008 1055   HGB 11.8* 07/04/2008 1055   HCT 35.5* 07/04/2008 1055   PLT 176 07/04/2008 1055   MCV 93.0 07/04/2008 1055   MCHC 33.4 07/04/2008 1055     RDW 14.3 07/04/2008 1055   LYMPHSABS 1.2 07/04/2008 1055   MONOABS 0.3 07/04/2008 1055   EOSABS 0.1 07/04/2008 1055   BASOSABS 0.0 07/04/2008 1055

## 2012-04-10 NOTE — Patient Instructions (Addendum)
Your physician wants you to follow-up in: 1 YEAR WITH DR. WALL. You will receive a reminder letter in the mail two months in advance. If you don't receive a letter, please call our office to schedule the follow-up appointment.   NO CHANGES WERE MADE TODAY 

## 2012-05-30 ENCOUNTER — Encounter: Payer: Self-pay | Admitting: Orthopedic Surgery

## 2012-05-30 ENCOUNTER — Ambulatory Visit (INDEPENDENT_AMBULATORY_CARE_PROVIDER_SITE_OTHER): Payer: Medicare Other

## 2012-05-30 ENCOUNTER — Ambulatory Visit (INDEPENDENT_AMBULATORY_CARE_PROVIDER_SITE_OTHER): Payer: Medicare Other | Admitting: Orthopedic Surgery

## 2012-05-30 VITALS — BP 120/60 | Ht 68.0 in | Wt 204.0 lb

## 2012-05-30 DIAGNOSIS — M25569 Pain in unspecified knee: Secondary | ICD-10-CM

## 2012-05-30 DIAGNOSIS — M25561 Pain in right knee: Secondary | ICD-10-CM

## 2012-05-30 DIAGNOSIS — M179 Osteoarthritis of knee, unspecified: Secondary | ICD-10-CM | POA: Insufficient documentation

## 2012-05-30 DIAGNOSIS — M1710 Unilateral primary osteoarthritis, unspecified knee: Secondary | ICD-10-CM | POA: Insufficient documentation

## 2012-05-30 DIAGNOSIS — M25469 Effusion, unspecified knee: Secondary | ICD-10-CM

## 2012-05-30 NOTE — Patient Instructions (Addendum)
You have received a steroid shot. 15% of patients experience increased pain at the injection site with in the next 24 hours. This is best treated with ice and tylenol extra strength 2 tabs every 8 hours. If you are still having pain please call the office.   Wear knee brace   Ice as needed   Take medication for pain

## 2012-05-30 NOTE — Progress Notes (Signed)
Patient ID: Devin Warner, male   DOB: 12/02/1926, 78 y.o.   MRN: [id number] Chief Complaint  Patient presents with  . Knee Pain    Right knee pain, no injury.    78 year old male is some history of coronary artery disease, presents with pain and swelling of the RIGHT knee, which started in September, although has had intermittent symptoms over the years and diffuse knee pain. Sharp throbbing, and turgor, 5 in intensity, it is constant, worse with squatting, so far, nothing has made it better. He has some swelling and giving way symptoms as well. Review of systems is notable for dysuria, unsteady gait, dizziness, tremors, anxiety, depression, easy bleeding. He denies chest pain, shortness of breath, heartburn skin rashes, excessive thirst,  vision disturbance, weight loss or weight gain.  The patient's allergies are recorded, the medical and surgical history have been recorded, medications family history and social history have been recorded and all have been reviewed.   BP 120/60  Ht 5\' 8"  (1.727 m)  Wt 204 lb (92.534 kg)  BMI 31.02 kg/m2  Vital signs are stable as recorded  General appearance is normal  The patient is alert and oriented x3  The patient's mood and affect are normal  Gait assessment: Abnormal gait pattern, favoring the RIGHT leg requiring the use of a walker The cardiovascular exam reveals normal pulses and temperature without edema swelling.  The lymphatic system is negative for palpable lymph nodes  The sensory exam is normal.  There are no pathologic reflexes.  Balance is normal.   Exam of the RIGHT knee Inspection Swelling of the joint with a large joint effusion, medial joint line tenderness, mild varus deformity. Range of motion limited to, 90, knee, stable, muscle tone and strength, normal, skin intact without erythema.  Upper extremity exam  Inspection and palpation revealed no abnormalities in the upper extremities.  Range of motion is full without  contracture.  Motor exam is normal with grade 5 strength.  The joints are fully reduced without subluxation.  There is no atrophy or tremor and muscle tone is normal.  All joints are stable.  Office x-rays today show varus knee with medial joint space narrowing, consistent with degenerative arthritis  Impression joint effusion Impression osteoarthritis of the knee  We will aspirate the knee and then injected and then place him in a brace  Minimal. Follow him and see what happens. He is probably moderate risk for knee replacement surgery.  Aspiration RIGHT knee Verbal consent and timeout were completed for a RIGHT knee aspiration  Under sterile conditions the RIGHT knee was aspirated from a lateral approach.  Findings were clear yellow fluid 100cc   Knee  Injection Procedure Note  Pre-operative Diagnosis: right knee oa  Post-operative Diagnosis: same  Indications: pain  Anesthesia: ethyl chloride   Procedure Details   Verbal consent was obtained for the procedure. Time out was completed.The joint was prepped with alcohol, followed by  Ethyl chloride spray and A 20 gauge needle was inserted into the knee via lateral approach; 4ml 1% lidocaine and 1 ml of depomedrol  was then injected into the joint . The needle was removed and the area cleansed and dressed.  Complications:  None; patient tolerated the procedure well.

## 2012-07-04 ENCOUNTER — Ambulatory Visit (INDEPENDENT_AMBULATORY_CARE_PROVIDER_SITE_OTHER): Payer: Medicare Other | Admitting: Orthopedic Surgery

## 2012-07-04 VITALS — Ht 68.0 in | Wt 204.0 lb

## 2012-07-04 DIAGNOSIS — M25469 Effusion, unspecified knee: Secondary | ICD-10-CM

## 2012-07-04 DIAGNOSIS — M1712 Unilateral primary osteoarthritis, left knee: Secondary | ICD-10-CM | POA: Insufficient documentation

## 2012-07-04 NOTE — Patient Instructions (Signed)
You have received a steroid shot. 15% of patients experience increased pain at the injection site with in the next 24 hours. This is best treated with ice and tylenol extra strength 2 tabs every 8 hours. If you are still having pain please call the office.    

## 2012-07-04 NOTE — Progress Notes (Signed)
Patient ID: Devin Warner, male   DOB: March 18, 1927, 78 y.o.   MRN: [id number] Chief Complaint  Patient presents with  . Follow-up    Recheck on right knee.    The patient presents with pain and swelling in his RIGHT knee, more severe when he made the appointment seems to settle down. Now. He is ambulatory with a walker. He has some loss of joint motion crepitance.  He has a significant heart disease, history Past Medical History  Diagnosis Date  . Coronary atherosclerosis of native coronary artery   . Syncope and collapse   . Spinal stenosis, lumbar region, without neurogenic claudication     He denies catching, locking, giving way of the knee  Ht 5\' 8"  (1.727 m)  Wt 204 lb (92.534 kg)  BMI 31.02 kg/m2 General appearance normal, mood and affect. Normal. Awake, alert, and oriented x3.  RIGHT knee, large joint effusion. Crepitus on range of motion medial and lateral joint line tenderness. Diffuse joint tenderness. Suggesting synovitis. Joint stable. Range of motion 110 mild varus deformity. Also noted.  Gait supported by walker.  Diagnosis osteoarthritis, LEFT knee, acute synovitis with effusion  Plan aspiration injection  Followup as needed  Aspiration RIGHT knee Verbal consent and timeout were completed for a RIGHT knee aspiration  Under sterile conditions the RIGHT knee was aspirated from a lateral approach.  Findings were clear yellow fluid 100 cc  This was followed by injection of the RIGHT knee with cortisone via the same approach. Medication.  is 40 mg Depo-Medrol, lidocaine 1% 3 cc.  A sterile dressing was applied there were no complications

## 2013-03-12 ENCOUNTER — Other Ambulatory Visit (HOSPITAL_COMMUNITY): Payer: Self-pay | Admitting: Pulmonary Disease

## 2013-03-12 DIAGNOSIS — N19 Unspecified kidney failure: Secondary | ICD-10-CM

## 2013-03-19 ENCOUNTER — Ambulatory Visit (HOSPITAL_COMMUNITY)
Admission: RE | Admit: 2013-03-19 | Discharge: 2013-03-19 | Disposition: A | Discharge status: Home / Self Care | Payer: Medicare Other | Source: Ambulatory Visit | Attending: Pulmonary Disease | Admitting: Pulmonary Disease

## 2013-03-19 DIAGNOSIS — Q619 Cystic kidney disease, unspecified: Secondary | ICD-10-CM | POA: Insufficient documentation

## 2013-03-19 DIAGNOSIS — N19 Unspecified kidney failure: Secondary | ICD-10-CM

## 2013-06-28 ENCOUNTER — Ambulatory Visit (HOSPITAL_COMMUNITY)
Admission: RE | Admit: 2013-06-28 | Discharge: 2013-06-28 | Disposition: A | Discharge status: Home / Self Care | Payer: Medicare Other | Source: Ambulatory Visit | Attending: Pulmonary Disease | Admitting: Pulmonary Disease

## 2013-06-28 ENCOUNTER — Other Ambulatory Visit (HOSPITAL_COMMUNITY): Payer: Self-pay | Admitting: Pulmonary Disease

## 2013-06-28 DIAGNOSIS — M25519 Pain in unspecified shoulder: Secondary | ICD-10-CM | POA: Insufficient documentation

## 2013-06-28 DIAGNOSIS — M25511 Pain in right shoulder: Secondary | ICD-10-CM

## 2013-07-21 ENCOUNTER — Encounter (HOSPITAL_COMMUNITY): Payer: Self-pay | Admitting: Emergency Medicine

## 2013-07-21 ENCOUNTER — Emergency Department (HOSPITAL_COMMUNITY)
Admission: EM | Admit: 2013-07-21 | Discharge: 2013-07-21 | Disposition: A | Discharge status: Home / Self Care | Payer: Medicare Other | Attending: Emergency Medicine | Admitting: Emergency Medicine

## 2013-07-21 DIAGNOSIS — Z8739 Personal history of other diseases of the musculoskeletal system and connective tissue: Secondary | ICD-10-CM | POA: Insufficient documentation

## 2013-07-21 DIAGNOSIS — Z79899 Other long term (current) drug therapy: Secondary | ICD-10-CM | POA: Insufficient documentation

## 2013-07-21 DIAGNOSIS — I2510 Atherosclerotic heart disease of native coronary artery without angina pectoris: Secondary | ICD-10-CM | POA: Insufficient documentation

## 2013-07-21 DIAGNOSIS — M25519 Pain in unspecified shoulder: Secondary | ICD-10-CM | POA: Insufficient documentation

## 2013-07-21 DIAGNOSIS — Z951 Presence of aortocoronary bypass graft: Secondary | ICD-10-CM | POA: Insufficient documentation

## 2013-07-21 DIAGNOSIS — Z87442 Personal history of urinary calculi: Secondary | ICD-10-CM | POA: Insufficient documentation

## 2013-07-21 DIAGNOSIS — Z7982 Long term (current) use of aspirin: Secondary | ICD-10-CM | POA: Insufficient documentation

## 2013-07-21 DIAGNOSIS — M25511 Pain in right shoulder: Secondary | ICD-10-CM

## 2013-07-21 HISTORY — DX: Calculus of kidney: N20.0

## 2013-07-21 HISTORY — DX: Unspecified osteoarthritis, unspecified site: M19.90

## 2013-07-21 MED ORDER — HYDROCODONE-ACETAMINOPHEN 5-325 MG PO TABS: 1.0000 | ORAL_TABLET | ORAL | Status: DC | PRN

## 2013-07-21 NOTE — ED Notes (Signed)
Pt c/o right shoulder pain that has become worse last night, denies any injury. Cms intact distal

## 2013-07-21 NOTE — ED Provider Notes (Signed)
CSN: [id number]     Arrival date & time 07/21/13  1525 History  This chart was scribed for Toy Baker, MD by Blanchard Kelch, ED Scribe. The patient was seen in room APA18/[facility]18. Patient's care was started at 3:38 PM.     Chief Complaint  Patient presents with  . Shoulder Pain    Patient is a 79 y.o. male presenting with shoulder pain. The history is provided by the patient and a relative. No language interpreter was used.  Shoulder Pain    HPI Comments: Devin Warner is a 79 y.o. male who presents to the Emergency Department complaining of constant right shoulder pain that began a few weeks ago but worsened yesterday. He describes the pain as sharp. It is worsened by movement. The pain began as neck pain and so he was seen by his PCP, Dr Juanetta Gosling about a week ago. Dr. Juanetta Gosling ordered an x-rayof the right shoulder and a note written by him stated that the right shoulder was normal. The patient denies any recent trauma to the area that he or his daughter knows of. He denies ever having surgery done on the shoulder.   Past Medical History  Diagnosis Date  . Coronary atherosclerosis of native coronary artery   . Syncope and collapse   . Spinal stenosis, lumbar region, without neurogenic claudication   . Arthritis   . Kidney stones    Past Surgical History  Procedure Laterality Date  . Esophagogastroduodenoscopy  02/17/2006    with esophageal dilation followed by colonscopy   . Hemicolectomy  04/03/2002  . 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  10/09/01    Surgeon Charlett Lango   . Back surgery    . Cataract extraction    . Coronary artery bypass graft     Family History  Problem Relation Age of Onset  . Coronary artery disease      family history   . Arthritis    . Cancer     History  Substance Use Topics  . Smoking status: Never Smoker   . Smokeless tobacco: Not on file  . Alcohol Use: No    Review of Systems  Musculoskeletal: Positive for arthralgias.   Skin: Negative for wound.  All other systems reviewed and are negative.    Allergies  Morphine  Home Medications   Current Outpatient Rx  Name  Route  Sig  Dispense  Refill  . ALPRAZolam (XANAX) 0.5 MG tablet   Oral   Take 0.5 mg by mouth daily as needed.           Marland Kitchen aspirin (ASPIRIN LOW DOSE) 81 MG EC tablet   Oral   Take 81 mg by mouth daily.           . fenofibrate micronized (LOFIBRA) 134 MG capsule   Oral   Take 134 mg by mouth daily.          . furosemide (LASIX) 40 MG tablet               . memantine (NAMENDA) 10 MG tablet   Oral   Take 10 mg by mouth 2 (two) times daily.           . metoprolol tartrate (LOPRESSOR) 25 MG tablet   Oral   Take 25 mg by mouth 2 (two) times daily.           . Multiple Vitamin (MULTIVITAMIN) tablet   Oral   Take 1 tablet by mouth daily.           Marland Kitchen  omega-3 acid ethyl esters (LOVAZA) 1 G capsule   Oral   Take 2 g by mouth 4 (four) times daily.           Marland Kitchen omeprazole (PRILOSEC) 20 MG capsule   Oral   Take 20 mg by mouth daily.           . rosuvastatin (CRESTOR) 10 MG tablet   Oral   Take 10 mg by mouth at bedtime.           . sertraline (ZOLOFT) 100 MG tablet   Oral   Take 100 mg by mouth daily.           . Tamsulosin HCl (FLOMAX) 0.4 MG CAPS   Oral   Take 0.4 mg by mouth daily.           . traMADol (ULTRAM) 50 MG tablet   Oral   Take 50 mg by mouth daily.           . vitamin B-12 (CYANOCOBALAMIN) 1000 MCG tablet   Oral   Take 2,000 mcg by mouth daily.           Triage Vitals: BP 156/80  Pulse 76  Temp(Src) 97.5 F (36.4 C) (Oral)  Resp 16  Wt 197 lb (89.359 kg)  SpO2 98%  Physical Exam  Nursing note and vitals reviewed. Constitutional: He is oriented to person, place, and time. He appears well-developed and well-nourished.  Non-toxic appearance.  HENT:  Head: Normocephalic and atraumatic.  Eyes: Conjunctivae are normal. Pupils are equal, round, and reactive to light.   Neck: Normal range of motion.  Cardiovascular: Normal rate.   Right radial pulse 2+  Pulmonary/Chest: Effort normal.  Musculoskeletal:  Tenderness to palpation of right supraspinatus muscle. Full ROM of shoulder somewhat limited by pain. Neurovascularly intact in right hand.  Neurological: He is alert and oriented to person, place, and time.  Skin: Skin is warm and dry.  Skin normal. No bruising noted.  Psychiatric: He has a normal mood and affect.    ED Course  Procedures (including critical care time)  DIAGNOSTIC STUDIES: Oxygen Saturation is 98% on room air, normal by my interpretation.    COORDINATION OF CARE: 3:48 PM - Will order pain medication for the shoulder. Patient verbalizes understanding and agrees with treatment plan.    Labs Review Labs Reviewed - No data to display Imaging Review No results found.  EKG Interpretation   None       MDM  No diagnosis found. Pt with likely joint arthritis, h/o same in past, pain meds given, no concern for cardiac etiology of pain, stable for d/c  I personally performed the services described in this documentation, which was scribed in my presence. The recorded information has been reviewed and is accurate.     Toy Baker, MD 07/21/13 [phone]

## 2013-07-27 ENCOUNTER — Emergency Department (HOSPITAL_COMMUNITY)
Admission: EM | Admit: 2013-07-27 | Discharge: 2013-07-27 | Disposition: A | Discharge status: Home / Self Care | Payer: Medicare Other | Attending: Emergency Medicine | Admitting: Emergency Medicine

## 2013-07-27 ENCOUNTER — Encounter (HOSPITAL_COMMUNITY): Payer: Self-pay | Admitting: Emergency Medicine

## 2013-07-27 DIAGNOSIS — G8929 Other chronic pain: Secondary | ICD-10-CM | POA: Insufficient documentation

## 2013-07-27 DIAGNOSIS — Z87442 Personal history of urinary calculi: Secondary | ICD-10-CM | POA: Insufficient documentation

## 2013-07-27 DIAGNOSIS — M25519 Pain in unspecified shoulder: Secondary | ICD-10-CM | POA: Insufficient documentation

## 2013-07-27 DIAGNOSIS — I2510 Atherosclerotic heart disease of native coronary artery without angina pectoris: Secondary | ICD-10-CM | POA: Insufficient documentation

## 2013-07-27 DIAGNOSIS — M549 Dorsalgia, unspecified: Secondary | ICD-10-CM | POA: Insufficient documentation

## 2013-07-27 DIAGNOSIS — M129 Arthropathy, unspecified: Secondary | ICD-10-CM | POA: Insufficient documentation

## 2013-07-27 DIAGNOSIS — M542 Cervicalgia: Secondary | ICD-10-CM | POA: Insufficient documentation

## 2013-07-27 DIAGNOSIS — Z79899 Other long term (current) drug therapy: Secondary | ICD-10-CM | POA: Insufficient documentation

## 2013-07-27 DIAGNOSIS — M25511 Pain in right shoulder: Secondary | ICD-10-CM

## 2013-07-27 DIAGNOSIS — Z951 Presence of aortocoronary bypass graft: Secondary | ICD-10-CM | POA: Insufficient documentation

## 2013-07-27 MED ORDER — OXYCODONE-ACETAMINOPHEN 5-325 MG PO TABS: 2.0000 | ORAL_TABLET | ORAL | Status: DC | PRN

## 2013-07-27 MED ORDER — PREDNISONE 10 MG PO TABS: 20.0000 mg | ORAL_TABLET | Freq: Two times a day (BID) | ORAL | Status: DC

## 2013-07-27 MED ORDER — HYDROMORPHONE HCL PF 1 MG/ML IJ SOLN
1.0000 mg | Freq: Once | INTRAMUSCULAR | Status: AC
  Administered 2013-07-27: 1 mg via INTRAMUSCULAR
  Filled 2013-07-27: qty 1

## 2013-07-27 NOTE — ED Notes (Signed)
Complain of pain in right shoulder. Here the othe day for same. Was given a Rx for hydrocodone. States he has been to Dr. Juanetta Gosling sine and got another Rx for same.
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
## 2013-08-01 ENCOUNTER — Encounter: Payer: Self-pay | Admitting: Orthopedic Surgery

## 2013-08-01 ENCOUNTER — Ambulatory Visit (INDEPENDENT_AMBULATORY_CARE_PROVIDER_SITE_OTHER): Payer: Medicare Other | Admitting: Orthopedic Surgery

## 2013-08-01 VITALS — BP 136/78 | Ht 67.0 in | Wt 198.0 lb

## 2013-08-01 DIAGNOSIS — M67919 Unspecified disorder of synovium and tendon, unspecified shoulder: Secondary | ICD-10-CM

## 2013-08-01 DIAGNOSIS — M7550 Bursitis of unspecified shoulder: Secondary | ICD-10-CM | POA: Insufficient documentation

## 2013-08-01 DIAGNOSIS — M7551 Bursitis of right shoulder: Secondary | ICD-10-CM

## 2013-08-01 MED ORDER — HYDROCODONE-ACETAMINOPHEN 5-325 MG PO TABS: 1.0000 | ORAL_TABLET | ORAL | Status: DC | PRN

## 2013-08-01 NOTE — Patient Instructions (Signed)
  Ice as needed 20 minutes 3 times a day Sling as needed  Joint Injection  Care After  Refer to this sheet in the next few days. These instructions provide you with information on caring for yourself after you have had a joint injection. Your caregiver also may give you more specific instructions. Your treatment has been planned according to current medical practices, but problems sometimes occur. Call your caregiver if you have any problems or questions after your procedure.  After any type of joint injection, it is not uncommon to experience:  Soreness, swelling, or bruising around the injection site.  Mild numbness, tingling, or weakness around the injection site caused by the numbing medicine used before or with the injection. It also is possible to experience the following effects associated with the specific agent after injection:  Iodine-based contrast agents:  Allergic reaction (itching, hives, widespread redness, and swelling beyond the injection site).  Corticosteroids (These effects are rare.):  Allergic reaction.  Increased blood sugar levels (If you have diabetes and you notice that your blood sugar levels have increased, notify your caregiver).  Increased blood pressure levels.  Mood swings.  Hyaluronic acid in the use of viscosupplementation.  Temporary heat or redness.  Temporary rash and itching.  Increased fluid accumulation in the injected joint. These effects all should resolve within a day after your procedure.  HOME CARE INSTRUCTIONS  Limit yourself to light activity the day of your procedure. Avoid lifting heavy objects, bending, stooping, or twisting.  Take prescription or over-the-counter pain medication as directed by your caregiver.  You may apply ice to your injection site to reduce pain and swelling the day of your procedure. Ice may be applied 3-4 times:  Put ice in a plastic bag.  Place a towel between your skin and the bag.  Leave the ice on for no longer  than 15-20 minutes each time. SEEK IMMEDIATE MEDICAL CARE IF:  Pain and swelling get worse rather than better or extend beyond the injection site.  Numbness does not go away.  Blood or fluid continues to leak from the injection site.  You have chest pain.  You have swelling of your face or tongue.  You have trouble breathing or you become dizzy.  You develop a fever, chills, or severe tenderness at the injection site that last longer than 1 day. MAKE SURE YOU:  Understand these instructions.  Watch your condition.  Get help right away if you are not doing well or if you get worse. Document Released: 04/21/2011 Document Revised: 10/31/2011 Document Reviewed: 04/21/2011  ExitCare Patient Information 2014 Devin Warner

## 2013-08-01 NOTE — Progress Notes (Signed)
Patient ID: Devin Warner, male   DOB: [birth year], 79 y.o.   MRN: [id number]  Chief Complaint  Patient presents with  . Shoulder Pain    [facility] follow up  Right shoulder pain. Consult from Dr. Juanetta Gosling    79 year old male went to the ER 2 occasions over the last week for acute onset of severe right shoulder pain associated with loss of motion. Denies numbness tingling or weakness. Denies trauma  Review of systems negative for any other musculoskeletal complaints  He denies neurologic symptoms of numbness and tingling in the right upper extremity  Past Medical History  Diagnosis Date  . Coronary atherosclerosis of native coronary artery   . Syncope and collapse   . Spinal stenosis, lumbar region, without neurogenic claudication   . Arthritis   . Kidney stones     BP 136/78  Ht 5\' 7"  (1.702 m)  Wt 198 lb (89.812 kg)  BMI 31.00 kg/m2 He is awake alert and oriented x3 his mood is pleasant his overall appearance is normal. Exam of the port with a walker.  He has tenderness in the peri-acromial region of the right shoulder but has normal range of motion up to 4 elevation of 120, shoulder stable, cuff intact, skin normal good pulse normal sensation  Negative x-ray  Bursitis Shoulder Injection Procedure Note   Pre-operative Diagnosis: right  RC Syndrome  Post-operative Diagnosis: same  Indications: pain   Anesthesia: ethyl chloride   Procedure Details   Verbal consent was obtained for the procedure. The shoulder was prepped withalcohol and the skin was anesthetized. A 20 gauge needle was advanced into the subacromial space through posterior approach without difficulty  The space was then injected with 3 ml 1% lidocaine and 1 ml of depomedrol. The injection site was cleansed with isopropyl alcohol and a dressing was applied.  Complications:  None; patient tolerated the procedure well.   As needed followup  Meds ordered this encounter  Medications  .  HYDROcodone-acetaminophen (NORCO/VICODIN) 5-325 MG per tablet    Sig: Take 1 tablet by mouth every 4 (four) hours as needed.    Dispense:  60 tablet    Refill:  0

## 2013-09-03 ENCOUNTER — Telehealth: Payer: Self-pay | Admitting: Orthopedic Surgery

## 2013-09-03 NOTE — Telephone Encounter (Signed)
Faxed note to Attention Maryann Alar at Dr. Luan Pulling' office, to please advise, per Dr. Ruthe Mannan response.

## 2013-09-03 NOTE — Telephone Encounter (Signed)
Patient's primary care physician's office, Dr. Luan Pulling, called regarding patient's request for another appointment for right shoulder - states still hurting;  Patient had been seen here 08/01/13 for this problem; patient had injection at that time.  Please advise, okay to schedule, or other recommendation?  Please notify Dr. Luan Pulling' office, Arbie Cookey, at ph# [phone], and call patient directly, ph#[phone] (Home)

## 2013-09-03 NOTE — Telephone Encounter (Signed)
Devin Warner, at Primary care office, said it is his shoulder.  Tried to call patient as well, no answer or answer machine. Please advise.

## 2013-09-03 NOTE — Telephone Encounter (Signed)
KNEE OR SHOULDER ? PLEASE CONFIRM THEN GET BACK TO ME.

## 2013-09-03 NOTE — Telephone Encounter (Signed)
Please confirm with Dr. Luan Pulling office that this patient is a surgical candidate if we needed to do open rotator cuff repair prior to scheduling a new appointment

## 2013-09-04 ENCOUNTER — Encounter: Payer: Self-pay | Admitting: *Deleted

## 2013-09-04 NOTE — Telephone Encounter (Signed)
09/04/13 - Received faxed note back from Maryann Alar, from Dr. Luan Pulling' office, stating "Yes" (to question, "is patient a surgical candidate?"  Routing to nurse to review notes, and to send letter of request for medical clearance.

## 2013-09-04 NOTE — Telephone Encounter (Signed)
Faxed a letter for medical clearance to DR. Hawkins's office.

## 2013-09-05 NOTE — Telephone Encounter (Signed)
Ok schedule him for appoitment

## 2013-09-05 NOTE — Telephone Encounter (Signed)
Done

## 2013-09-05 NOTE — Telephone Encounter (Signed)
Called back to patient; appointment has been scheduled for 09/12/13.

## 2013-09-12 ENCOUNTER — Ambulatory Visit (INDEPENDENT_AMBULATORY_CARE_PROVIDER_SITE_OTHER): Payer: Medicare Other | Admitting: Orthopedic Surgery

## 2013-09-12 ENCOUNTER — Encounter: Payer: Self-pay | Admitting: Orthopedic Surgery

## 2013-09-12 VITALS — BP 146/84 | Ht 67.0 in | Wt 198.0 lb

## 2013-09-12 DIAGNOSIS — M75101 Unspecified rotator cuff tear or rupture of right shoulder, not specified as traumatic: Secondary | ICD-10-CM

## 2013-09-12 DIAGNOSIS — S43429A Sprain of unspecified rotator cuff capsule, initial encounter: Secondary | ICD-10-CM

## 2013-09-12 NOTE — Patient Instructions (Signed)
An MRI will be scheduled and the doctor will call you with the results and discuss further treatment options.

## 2013-09-12 NOTE — Progress Notes (Signed)
Patient ID: Devin Warner, male   DOB: [birth year], 80 y.o.   MRN: [id number] Chief Complaint  Patient presents with  . Follow-up    Right Shoulder follow up , discuss surgery    After speaking with Dr. Luan Pulling reevaluating Mr. Bolle he complains of pain in his right shoulder with forward elevation and weakness and pain at night when sleeping on his right side. Past Medical History  Diagnosis Date  . Coronary atherosclerosis of native coronary artery   . Syncope and collapse   . Spinal stenosis, lumbar region, without neurogenic claudication   . Arthritis   . Kidney stones    Currently no active review of systems  Dr. Luan Pulling thinks that the patient is okay for surgery however in discussing this with Mr. Holeman he indicates that Dr. Yolanda Bonine that he should not have any general anesthesia  At this point we are at a Catch 22 however, I will go ahead and get the MRI the shoulder to see if he has a cuff tear or inflammation. I looked at his x-ray it shows bony changes consistent with chronic rotator cuff disease without proximal migration of the humerus  Reinject right shoulder subacromial space  Shoulder Injection Procedure Note   Pre-operative Diagnosis: right  RC Syndrome  Post-operative Diagnosis: same  Indications: pain   Anesthesia: ethyl chloride   Procedure Details   Verbal consent was obtained for the procedure. The shoulder was prepped withalcohol and the skin was anesthetized. A 20 gauge needle was advanced into the subacromial space through posterior approach without difficulty  The space was then injected with 3 ml 1% lidocaine and 1 ml of depomedrol. The injection site was cleansed with isopropyl alcohol and a dressing was applied.  Complications:  None; patient tolerated the procedure well.

## 2013-09-24 ENCOUNTER — Ambulatory Visit (HOSPITAL_COMMUNITY)
Admission: RE | Admit: 2013-09-24 | Discharge: 2013-09-24 | Disposition: A | Discharge status: Home / Self Care | Payer: Medicare Other | Source: Ambulatory Visit | Attending: Orthopedic Surgery | Admitting: Orthopedic Surgery

## 2013-09-24 DIAGNOSIS — M25619 Stiffness of unspecified shoulder, not elsewhere classified: Secondary | ICD-10-CM | POA: Insufficient documentation

## 2013-09-24 DIAGNOSIS — X58XXXA Exposure to other specified factors, initial encounter: Secondary | ICD-10-CM | POA: Insufficient documentation

## 2013-09-24 DIAGNOSIS — S46819A Strain of other muscles, fascia and tendons at shoulder and upper arm level, unspecified arm, initial encounter: Secondary | ICD-10-CM | POA: Insufficient documentation

## 2013-09-24 DIAGNOSIS — M19019 Primary osteoarthritis, unspecified shoulder: Secondary | ICD-10-CM | POA: Insufficient documentation

## 2013-09-24 DIAGNOSIS — M75101 Unspecified rotator cuff tear or rupture of right shoulder, not specified as traumatic: Secondary | ICD-10-CM

## 2013-09-24 DIAGNOSIS — M25519 Pain in unspecified shoulder: Secondary | ICD-10-CM | POA: Insufficient documentation

## 2013-10-03 ENCOUNTER — Telehealth: Payer: Self-pay | Admitting: Orthopedic Surgery

## 2013-10-03 NOTE — Telephone Encounter (Signed)
Please call patient with MRI results (09/24/13.  Patient [phone] Please note - patient does not have an answer machine. *   * Daughter Sydnee Cabal, will also try to call late Tuesday, 10/08/13 or early Wednesday, 10/09/13, on conference call with patient, as she said he may not understand on phone.  She is in system as contact/POA.

## 2013-10-14 ENCOUNTER — Telehealth: Payer: Self-pay | Admitting: Orthopedic Surgery

## 2013-10-14 NOTE — Telephone Encounter (Signed)
The patient would like the daughter called

## 2013-10-23 ENCOUNTER — Telehealth: Payer: Self-pay | Admitting: Orthopedic Surgery

## 2013-10-23 NOTE — Telephone Encounter (Signed)
Devin Warner Hayward [name]) asked if you will call her with [name]'s MRI results and treatment plan.  She said her mother did not remember or understand what you  Told her on the phone.  [name] is [name]'s POA  And can be reached at her cell # [phone]

## 2014-02-03 ENCOUNTER — Ambulatory Visit (INDEPENDENT_AMBULATORY_CARE_PROVIDER_SITE_OTHER): Payer: Medicare Other | Admitting: Orthopedic Surgery

## 2014-02-03 VITALS — BP 124/82 | Ht 67.0 in | Wt 198.0 lb

## 2014-02-03 DIAGNOSIS — S43429A Sprain of unspecified rotator cuff capsule, initial encounter: Secondary | ICD-10-CM

## 2014-02-03 DIAGNOSIS — M75101 Unspecified rotator cuff tear or rupture of right shoulder, not specified as traumatic: Secondary | ICD-10-CM | POA: Insufficient documentation

## 2014-02-03 NOTE — Progress Notes (Signed)
Patient ID: Devin Warner, male   DOB: 1926-08-30, 80 y.o.   MRN: [id number]  Chief Complaint  Patient presents with  . Follow-up    recheck right shoulder    BP 124/82  Ht 5\' 7"  (1.702 m)  Wt 198 lb (89.812 kg)  BMI 31.00 kg/m2  Procedure inject subacromial space right shoulder Diagnosis rotator cuff syndrome right  shoulder Medication Depo-Medrol 40 mg, 1 cc and lidocaine 1% 3 cc Verbal consent Timeout completed  The injection site was cleaned with alcohol and sprayed with ethyl chloride. From a posterior approach a 20-gauge needle was injected in the subacromial space. The medication went in easily. There were no complications. The wound was covered with a sterile bandage. Appropriate precautions were given.  F/u prn

## 2014-02-03 NOTE — Patient Instructions (Signed)

## 2014-02-18 ENCOUNTER — Encounter (INDEPENDENT_AMBULATORY_CARE_PROVIDER_SITE_OTHER): Payer: Self-pay | Admitting: *Deleted

## 2014-02-23 ENCOUNTER — Emergency Department (HOSPITAL_COMMUNITY): Payer: Medicare Other

## 2014-02-23 ENCOUNTER — Emergency Department (HOSPITAL_COMMUNITY)
Admission: EM | Admit: 2014-02-23 | Discharge: 2014-02-23 | Disposition: A | Discharge status: Home / Self Care | Payer: Medicare Other | Attending: Emergency Medicine | Admitting: Emergency Medicine

## 2014-02-23 ENCOUNTER — Encounter (HOSPITAL_COMMUNITY): Payer: Self-pay | Admitting: Emergency Medicine

## 2014-02-23 DIAGNOSIS — Z7982 Long term (current) use of aspirin: Secondary | ICD-10-CM | POA: Insufficient documentation

## 2014-02-23 DIAGNOSIS — Z9889 Other specified postprocedural states: Secondary | ICD-10-CM | POA: Insufficient documentation

## 2014-02-23 DIAGNOSIS — Z79899 Other long term (current) drug therapy: Secondary | ICD-10-CM | POA: Insufficient documentation

## 2014-02-23 DIAGNOSIS — R079 Chest pain, unspecified: Secondary | ICD-10-CM | POA: Insufficient documentation

## 2014-02-23 DIAGNOSIS — Z951 Presence of aortocoronary bypass graft: Secondary | ICD-10-CM | POA: Insufficient documentation

## 2014-02-23 DIAGNOSIS — F0390 Unspecified dementia without behavioral disturbance: Secondary | ICD-10-CM

## 2014-02-23 DIAGNOSIS — M129 Arthropathy, unspecified: Secondary | ICD-10-CM | POA: Insufficient documentation

## 2014-02-23 DIAGNOSIS — N289 Disorder of kidney and ureter, unspecified: Secondary | ICD-10-CM

## 2014-02-23 DIAGNOSIS — R52 Pain, unspecified: Secondary | ICD-10-CM | POA: Insufficient documentation

## 2014-02-23 DIAGNOSIS — G8929 Other chronic pain: Secondary | ICD-10-CM | POA: Insufficient documentation

## 2014-02-23 DIAGNOSIS — Z8739 Personal history of other diseases of the musculoskeletal system and connective tissue: Secondary | ICD-10-CM | POA: Insufficient documentation

## 2014-02-23 DIAGNOSIS — Z87442 Personal history of urinary calculi: Secondary | ICD-10-CM | POA: Insufficient documentation

## 2014-02-23 DIAGNOSIS — I2510 Atherosclerotic heart disease of native coronary artery without angina pectoris: Secondary | ICD-10-CM | POA: Insufficient documentation

## 2014-02-23 LAB — CBC WITH DIFFERENTIAL/PLATELET
BASOS ABS: 0 10*3/uL (ref 0.0–0.1)
Basophils Relative: 0 % (ref 0–1)
EOS ABS: 0.3 10*3/uL (ref 0.0–0.7)
EOS PCT: 7 % — AB (ref 0–5)
HEMATOCRIT: 33.1 % — AB (ref 39.0–52.0)
Hemoglobin: 11 g/dL — ABNORMAL LOW (ref 13.0–17.0)
LYMPHS ABS: 2.2 10*3/uL (ref 0.7–4.0)
Lymphocytes Relative: 47 % — ABNORMAL HIGH (ref 12–46)
MCH: 31.1 pg (ref 26.0–34.0)
MCHC: 33.2 g/dL (ref 30.0–36.0)
MCV: 93.5 fL (ref 78.0–100.0)
MONO ABS: 0.3 10*3/uL (ref 0.1–1.0)
Monocytes Relative: 6 % (ref 3–12)
Neutro Abs: 1.9 10*3/uL (ref 1.7–7.7)
Neutrophils Relative %: 40 % — ABNORMAL LOW (ref 43–77)
PLATELETS: 180 10*3/uL (ref 150–400)
RBC: 3.54 MIL/uL — ABNORMAL LOW (ref 4.22–5.81)
RDW: 13.9 % (ref 11.5–15.5)
WBC: 4.7 10*3/uL (ref 4.0–10.5)

## 2014-02-23 LAB — BASIC METABOLIC PANEL
Anion gap: 11 (ref 5–15)
BUN: 26 mg/dL — AB (ref 6–23)
CALCIUM: 9.7 mg/dL (ref 8.4–10.5)
CO2: 27 meq/L (ref 19–32)
Chloride: 103 mEq/L (ref 96–112)
Creatinine, Ser: 2.19 mg/dL — ABNORMAL HIGH (ref 0.50–1.35)
GFR calc Af Amer: 30 mL/min — ABNORMAL LOW (ref >90)
GFR, EST NON AFRICAN AMERICAN: 26 mL/min — AB (ref >90)
GLUCOSE: 99 mg/dL (ref 70–99)
Potassium: 4 mEq/L (ref 3.7–5.3)
Sodium: 141 mEq/L (ref 137–147)

## 2014-02-23 LAB — TROPONIN I

## 2014-02-23 NOTE — ED Provider Notes (Signed)
CSN: [id number]     Arrival date & time 02/23/14  1000 History  This chart was scribed for Babette Relic, MD by Girtha Hake, ED Scribe. The patient was seen in [place]. The patient's care was started at 11:09 AM.     Chief Complaint  Patient presents with  . Chest Pain    Patient is a 80 y.o. male presenting with chest pain. The history is provided by the patient. No language interpreter was used.  Chest Pain  HPI Comments: Devin Warner is a 80 y.o. male with a history of coronary bypass surgery who presents to the Emergency Department complaining of chronic, non-radiating almost daily chest pain for several years. He characterizes the CP as a "squeezing pressure." Patient reports that the pain has been increased in severity for the past 12 hours. He reports that typical episodes of pain can last for several minutes or several hours. The pain is not related to position or activity. Patient denies any associated symptoms. He denies nausea or SOB.   Patient also reports 24/7 for years chronic baseline soreness in his abdomen, chest wall, shoulders, and back. PCP is Dr. Luan Pulling.   Past Medical History  Diagnosis Date  . Coronary atherosclerosis of native coronary artery   . Syncope and collapse   . Spinal stenosis, lumbar region, without neurogenic claudication   . Arthritis   . Kidney stones Dementia     Past Surgical History  Procedure Laterality Date  . Esophagogastroduodenoscopy  02/17/2006    with esophageal dilation followed by colonscopy   . Hemicolectomy  04/03/2002  . 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  10/09/01    Surgeon Laurita Quint   . Back surgery    . Cataract extraction    . Coronary artery bypass graft     Family History  Problem Relation Age of Onset  . Coronary artery disease      family history   . Arthritis    . Cancer     History  Substance Use Topics  . Smoking status: Never Smoker   . Smokeless tobacco: Not on file  . Alcohol  Use: No    Review of Systems  Cardiovascular: Positive for chest pain.   10 Systems reviewed and are negative for acute change except as noted in the HPI.    Allergies  Morphine  Home Medications   Prior to Admission medications   Medication Sig Start Date End Date Taking? Authorizing Provider  acetaminophen (TYLENOL) 500 MG tablet Take 500 mg by mouth every 6 (six) hours as needed for mild pain.   Yes Historical Provider, MD  ALPRAZolam Duanne Moron) 0.5 MG tablet Take 0.5 mg by mouth daily as needed for anxiety.    Yes Historical Provider, MD  aspirin (ASPIRIN LOW DOSE) 81 MG EC tablet Take 81 mg by mouth daily.     Yes Historical Provider, MD  fenofibrate micronized (LOFIBRA) 134 MG capsule Take 134 mg by mouth daily.  02/21/11  Yes Historical Provider, MD  furosemide (LASIX) 40 MG tablet Take 40 mg by mouth daily.  06/29/12  Yes Historical Provider, MD  memantine (NAMENDA) 10 MG tablet Take 10 mg by mouth daily.    Yes Historical Provider, MD  metoprolol tartrate (LOPRESSOR) 25 MG tablet Take 25 mg by mouth daily.    Yes Historical Provider, MD  Multiple Vitamin (MULTIVITAMIN) tablet Take 1 tablet by mouth daily.     Yes Historical Provider, MD  omega-3 acid ethyl  esters (LOVAZA) 1 G capsule Take 1 g by mouth 2 (two) times daily.    Yes Historical Provider, MD  oxyCODONE-acetaminophen (PERCOCET/ROXICET) 5-325 MG per tablet Take 1 tablet by mouth every 4 (four) hours as needed for moderate pain or severe pain.   Yes Historical Provider, MD  pantoprazole (PROTONIX) 40 MG tablet Take 40 mg by mouth daily.   Yes Historical Provider, MD  Tamsulosin HCl (FLOMAX) 0.4 MG CAPS Take 0.4 mg by mouth daily.     Yes Historical Provider, MD  traMADol (ULTRAM) 50 MG tablet Take 50 mg by mouth daily as needed for moderate pain.    Yes Historical Provider, MD  vitamin B-12 (CYANOCOBALAMIN) 1000 MCG tablet Take 1,000 mcg by mouth daily.    Yes Historical Provider, MD   Triage Vitals: BP 115/68  Pulse 77   Temp(Src) 98.2 F (36.8 C) (Oral)  Resp 17  SpO2 96% Physical Exam  Nursing note and vitals reviewed. Constitutional:  Awake, alert, nontoxic appearance.  HENT:  Head: Atraumatic.  Eyes: Right eye exhibits no discharge. Left eye exhibits no discharge.  Neck: Neck supple.  Cardiovascular: Normal rate and regular rhythm.   No murmur heard. Pulmonary/Chest: Effort normal and breath sounds normal. No respiratory distress. He has no wheezes. He has no rales. He exhibits no tenderness.  Baseline chest wall tenderness.   Abdominal: Soft. Bowel sounds are normal. There is tenderness. There is no rebound.  Mild diffuse tenderness without rebound.  Musculoskeletal: He exhibits no tenderness.  Baseline ROM, no obvious new focal weakness.  Baseline tenderness across back and shoulders bilaterally.  Symmetric intact radial and DP pulses bilaterally in arms and legs. CP less than 2 seconds.   Neurological:  Mental status and motor strength appears baseline for patient and situation.  Skin: No rash noted.  Psychiatric: He has a normal mood and affect.    ED Course  Procedures (including critical care time) DIAGNOSTIC STUDIES: Oxygen Saturation is 96% on room air, normal by my interpretation.    COORDINATION OF CARE: 11:23 AM - Patient / Family / Caregiver understand and agree with initial ED impression and plan with expectations set for ED visit.    Labs Review Labs Reviewed  BASIC METABOLIC PANEL - Abnormal; Notable for the following:    BUN 26 (*)    Creatinine, Ser 2.19 (*)    GFR calc non Af Amer 26 (*)    GFR calc Af Amer 30 (*)    All other components within normal limits  CBC WITH DIFFERENTIAL - Abnormal; Notable for the following:    RBC 3.54 (*)    Hemoglobin 11.0 (*)    HCT 33.1 (*)    Neutrophils Relative % 40 (*)    Lymphocytes Relative 47 (*)    Eosinophils Relative 7 (*)    All other components within normal limits  TROPONIN I    Imaging Review Dg Chest  Portable 1 View  02/23/2014   CLINICAL DATA:  Chest pain  EXAM: PORTABLE CHEST - 1 VIEW  COMPARISON:  Portable chest x-ray dated Jan 05, 2008.  FINDINGS: The lungs are adequately inflated. The interstitial markings are mildly increased. The cardiac silhouette is enlarged. The central pulmonary vascularity is prominent. There is no pleural effusion. The patient has undergone previous CABG. The bony thorax is unremarkable where visualized.  IMPRESSION: Low-grade CHF with mild interstitial edema.   Electronically Signed   By: David  Martinique   On: 02/23/2014 10:24     EKG  Interpretation   Date/Time:  Sunday February 23 2014 10:04:55 EDT Ventricular Rate:  77 PR Interval:  217 QRS Duration: 103 QT Interval:  385 QTC Calculation: 436 R Axis:   32 Text Interpretation:  Sinus rhythm Borderline prolonged PR interval Low  voltage, precordial leads Abnormal R-wave progression, early transition  Borderline T abnormalities, anterior leads Compared to previous tracing 1  degree AV  block NO LONGER PRESENT Confirmed by Stevie Kern  MD, Jenny Reichmann ([id number])  on 02/23/2014 10:39:16 AM      MDM   Final diagnoses:  Chronic chest pain  Chronic generalized pain  Renal insufficiency  Dementia, without behavioral disturbance    I doubt any other EMC precluding discharge at this time including, but not necessarily limited to the following:ACS.  This chart was scribed in my presence and reviewed by me personally.   Babette Relic, MD 02/24/14 [phone]

## 2014-02-23 NOTE — ED Notes (Signed)
Pt was given NTG 2, SL and 324 asa prior to arrival

## 2014-02-23 NOTE — ED Notes (Signed)
Complain of chest pain that started early this morning. Denies other symptoms

## 2014-02-23 NOTE — Discharge Instructions (Signed)

## 2014-03-25 ENCOUNTER — Other Ambulatory Visit (INDEPENDENT_AMBULATORY_CARE_PROVIDER_SITE_OTHER): Payer: Self-pay | Admitting: *Deleted

## 2014-03-25 ENCOUNTER — Encounter (INDEPENDENT_AMBULATORY_CARE_PROVIDER_SITE_OTHER): Payer: Self-pay | Admitting: *Deleted

## 2014-03-25 ENCOUNTER — Ambulatory Visit (INDEPENDENT_AMBULATORY_CARE_PROVIDER_SITE_OTHER): Payer: Medicare Other | Admitting: Internal Medicine

## 2014-03-25 ENCOUNTER — Encounter (INDEPENDENT_AMBULATORY_CARE_PROVIDER_SITE_OTHER): Payer: Self-pay | Admitting: Internal Medicine

## 2014-03-25 VITALS — BP 112/72 | HR 72 | Temp 97.7°F | Ht 66.0 in | Wt 197.1 lb

## 2014-03-25 DIAGNOSIS — R1310 Dysphagia, unspecified: Secondary | ICD-10-CM

## 2014-03-25 NOTE — Progress Notes (Signed)
Subjective:     Patient ID: Normajean Baxter, male   DOB: Jul 15, 1927, 80 y.o.   MRN: [id number] (Wife and son providing hx. Patient is really unable to give me a hx). HPI Referred to our office by Dr. Luan Pulling for dysphagia.  If he takes too big of a bite, foods will lodge. Sometimes he will become hoarse. His wife says he will choke on the food, and he will vomit it back up. Symptoms for 2 months. Underwent an EGD in 2007.  No problems with pills.  Takes Oxycodone for shoulder pain. Hx of colon cancer and underwent a hemicolectomy in 2003.Marland Kitchen Last colonoscopy in 2007.  02/17/2006: EGD/ED: colonoscopy: FINAL DIAGNOSIS: Normal esophagogastroduodenoscopy. Tiny hyperplastic  polyp in stomach which was left alone. Next esophagus dilated by passing 56-  French Maloney dilator to full insertion.  Sigmoid colon diverticulosis, otherwise normal colonoscopy with patent  ileocolonic anastomosis.     Review of Systems     Past Medical History  Diagnosis Date  . Coronary atherosclerosis of native coronary artery   . Syncope and collapse   . Spinal stenosis, lumbar region, without neurogenic claudication   . Arthritis   . Kidney stones     Past Surgical History  Procedure Laterality Date  . Esophagogastroduodenoscopy  02/17/2006    with esophageal dilation followed by colonscopy   . Hemicolectomy  04/03/2002  . 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  10/09/01    Surgeon Laurita Quint   . Back surgery    . Cataract extraction    . Coronary artery bypass graft      Allergies  Allergen Reactions  . Morphine Hives, Anxiety and Rash    PT STATES MORPHINE MAKES HIM "WILD"    Current Outpatient Prescriptions on File Prior to Visit  Medication Sig Dispense Refill  . acetaminophen (TYLENOL) 500 MG tablet Take 500 mg by mouth every 6 (six) hours as needed for mild pain.      Marland Kitchen ALPRAZolam (XANAX) 0.5 MG tablet Take 0.5 mg by mouth daily as needed for anxiety.       Marland Kitchen aspirin (ASPIRIN  LOW DOSE) 81 MG EC tablet Take 81 mg by mouth daily.        . fenofibrate micronized (LOFIBRA) 134 MG capsule Take 134 mg by mouth daily.       . furosemide (LASIX) 40 MG tablet Take 40 mg by mouth daily.       . memantine (NAMENDA) 10 MG tablet Take 10 mg by mouth daily.       . metoprolol tartrate (LOPRESSOR) 25 MG tablet Take 25 mg by mouth daily.       . Multiple Vitamin (MULTIVITAMIN) tablet Take 1 tablet by mouth daily.        Marland Kitchen omega-3 acid ethyl esters (LOVAZA) 1 G capsule Take 1 g by mouth 2 (two) times daily.       Marland Kitchen oxyCODONE-acetaminophen (PERCOCET/ROXICET) 5-325 MG per tablet Take 1 tablet by mouth every 4 (four) hours as needed for moderate pain or severe pain.      . pantoprazole (PROTONIX) 40 MG tablet Take 40 mg by mouth daily.      . Tamsulosin HCl (FLOMAX) 0.4 MG CAPS Take 0.4 mg by mouth daily.        . traMADol (ULTRAM) 50 MG tablet Take 50 mg by mouth daily as needed for moderate pain.       . vitamin B-12 (CYANOCOBALAMIN) 1000 MCG tablet Take 1,000 mcg by  mouth daily.        No current facility-administered medications on file prior to visit.     Objective:   Physical Exam  Filed Vitals:   03/25/14 1115  BP: 112/72  Pulse: 72  Temp: 97.7 F (36.5 C)  Height: 5\' 6"  (1.676 m)  Weight: 197 lb 1.6 oz (89.404 kg)   Alert and oriented. Skin warm and dry. Oral mucosa is moist.   . Sclera anicteric, conjunctivae is pink. Thyroid not enlarged. No cervical lymphadenopathy. Lungs clear. Heart regular rate and rhythm.  Abdomen is soft. Bowel sounds are positive. No hepatomegaly. No abdominal masses felt. No tenderness.  No edema to lower extremities.       Assessment:     Dysphagia to solids. Stricture,web need to be ruled out. -    Plan:     EGD/ED. The risks and benefits such as perforation, bleeding, and infection were reviewed with the patient and is agreeable.

## 2014-03-25 NOTE — Patient Instructions (Addendum)
EGD/ED. The risks and benefits such as perforation, bleeding, and infection were reviewed with the patient and is agreeable. 

## 2014-04-14 ENCOUNTER — Encounter (HOSPITAL_COMMUNITY): Payer: Self-pay | Admitting: Pharmacy Technician

## 2014-04-30 ENCOUNTER — Encounter (HOSPITAL_COMMUNITY): Payer: Self-pay | Admitting: *Deleted

## 2014-04-30 ENCOUNTER — Encounter (HOSPITAL_COMMUNITY)
Admission: RE | Disposition: A | Discharge status: Home / Self Care | Payer: Self-pay | Source: Ambulatory Visit | Attending: Internal Medicine

## 2014-04-30 ENCOUNTER — Ambulatory Visit (HOSPITAL_COMMUNITY)
Admission: RE | Admit: 2014-04-30 | Discharge: 2014-04-30 | Disposition: A | Discharge status: Home / Self Care | Payer: Medicare Other | Source: Ambulatory Visit | Attending: Internal Medicine | Admitting: Internal Medicine

## 2014-04-30 DIAGNOSIS — Z9049 Acquired absence of other specified parts of digestive tract: Secondary | ICD-10-CM | POA: Diagnosis not present

## 2014-04-30 DIAGNOSIS — K449 Diaphragmatic hernia without obstruction or gangrene: Secondary | ICD-10-CM

## 2014-04-30 DIAGNOSIS — R1310 Dysphagia, unspecified: Secondary | ICD-10-CM | POA: Diagnosis not present

## 2014-04-30 DIAGNOSIS — Z79899 Other long term (current) drug therapy: Secondary | ICD-10-CM | POA: Insufficient documentation

## 2014-04-30 DIAGNOSIS — Z951 Presence of aortocoronary bypass graft: Secondary | ICD-10-CM | POA: Insufficient documentation

## 2014-04-30 DIAGNOSIS — K219 Gastro-esophageal reflux disease without esophagitis: Secondary | ICD-10-CM | POA: Insufficient documentation

## 2014-04-30 HISTORY — PX: BALLOON DILATION: SHX5330

## 2014-04-30 HISTORY — PX: SAVORY DILATION: SHX5439

## 2014-04-30 HISTORY — PX: MALONEY DILATION: SHX5535

## 2014-04-30 HISTORY — PX: ESOPHAGOGASTRODUODENOSCOPY: SHX5428

## 2014-04-30 SURGERY — EGD (ESOPHAGOGASTRODUODENOSCOPY)
Anesthesia: Moderate Sedation

## 2014-04-30 MED ORDER — MIDAZOLAM HCL 5 MG/5ML IJ SOLN
INTRAMUSCULAR | Status: DC | PRN
  Administered 2014-04-30: 2 mg via INTRAVENOUS
  Administered 2014-04-30: 1 mg via INTRAVENOUS

## 2014-04-30 MED ORDER — BUTAMBEN-TETRACAINE-BENZOCAINE 2-2-14 % EX AERO
INHALATION_SPRAY | CUTANEOUS | Status: DC | PRN
  Administered 2014-04-30: 2 via TOPICAL

## 2014-04-30 MED ORDER — MEPERIDINE HCL 50 MG/ML IJ SOLN
INTRAMUSCULAR | Status: DC | PRN
  Administered 2014-04-30: 25 mg via INTRAVENOUS

## 2014-04-30 MED ORDER — MIDAZOLAM HCL 5 MG/5ML IJ SOLN
INTRAMUSCULAR | Status: AC
  Filled 2014-04-30: qty 10

## 2014-04-30 MED ORDER — STERILE WATER FOR IRRIGATION IR SOLN
Status: DC | PRN
  Administered 2014-04-30: 13:00:00

## 2014-04-30 MED ORDER — SODIUM CHLORIDE 0.9 % IV SOLN
INTRAVENOUS | Status: DC
  Administered 2014-04-30: 1000 mL via INTRAVENOUS

## 2014-04-30 MED ORDER — MEPERIDINE HCL 50 MG/ML IJ SOLN
INTRAMUSCULAR | Status: AC
  Filled 2014-04-30: qty 1

## 2014-04-30 NOTE — H&P (Signed)
Devin Warner is an 80 y.o. male.   Chief Complaint: Patient is here for EGD and ED. HPI: Patient is a 3-year-old Caucasian male presents with over six-month history of dysphagia to solids. He points to mid sternal saw to bolus obstruction. He has swallowing difficulty couple times a week but not daily. He has heartburn no more than 2-3 times a week. He denies nausea vomiting abdominal pain melena or rectal bleeding. Last esophageal dilation was in 2007 with improvement in his dysphagia. He had barium study in 2008 which suggested esophageal motility disorder.  Past Medical History  Diagnosis Date  . Coronary atherosclerosis of native coronary artery   . Syncope and collapse   . Spinal stenosis, lumbar region, without neurogenic claudication   . Arthritis   . Kidney stones     Past Surgical History  Procedure Laterality Date  . Esophagogastroduodenoscopy  02/17/2006    with esophageal dilation followed by colonscopy   . Hemicolectomy  04/03/2002  . 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  10/09/01    Surgeon Laurita Quint   . Back surgery    . Cataract extraction    . Coronary artery bypass graft      Family History  Problem Relation Age of Onset  . Coronary artery disease      family history   . Arthritis    . Cancer     Social History:  reports that he has never smoked. He does not have any smokeless tobacco history on file. He reports that he does not drink alcohol or use illicit drugs.  Allergies:  Allergies  Allergen Reactions  . Morphine Hives, Anxiety and Rash    PT STATES MORPHINE MAKES HIM "WILD"    Medications Prior to Admission  Medication Sig Dispense Refill  . acetaminophen (TYLENOL) 500 MG tablet Take 500 mg by mouth every 6 (six) hours as needed for mild pain.      Marland Kitchen ALPRAZolam (XANAX) 0.5 MG tablet Take 0.5 mg by mouth daily as needed for anxiety.       Marland Kitchen aspirin (ASPIRIN LOW DOSE) 81 MG EC tablet Take 81 mg by mouth daily.        . fenofibrate  micronized (LOFIBRA) 134 MG capsule Take 134 mg by mouth daily.       . furosemide (LASIX) 40 MG tablet Take 40 mg by mouth daily.       . memantine (NAMENDA) 10 MG tablet Take 10 mg by mouth daily.       . metoprolol tartrate (LOPRESSOR) 25 MG tablet Take 25 mg by mouth daily.       . Multiple Vitamin (MULTIVITAMIN) tablet Take 1 tablet by mouth daily.        Marland Kitchen omega-3 acid ethyl esters (LOVAZA) 1 G capsule Take 1 g by mouth 2 (two) times daily.       Marland Kitchen oxyCODONE-acetaminophen (PERCOCET/ROXICET) 5-325 MG per tablet Take 1 tablet by mouth every 4 (four) hours as needed for moderate pain or severe pain.      . pantoprazole (PROTONIX) 40 MG tablet Take 40 mg by mouth daily.      . Tamsulosin HCl (FLOMAX) 0.4 MG CAPS Take 0.4 mg by mouth daily.        . traMADol (ULTRAM) 50 MG tablet Take 50 mg by mouth daily as needed for moderate pain.       . vitamin B-12 (CYANOCOBALAMIN) 1000 MCG tablet Take 1,000 mcg by mouth daily.  No results found for this or any previous visit (from the past 48 hour(s)). No results found.  ROS  Blood pressure 158/91, pulse 87, temperature 97.4 F (36.3 C), resp. rate 21, height 5\' 10"  (1.778 m), weight 194 lb (87.998 kg), SpO2 99.00%. Physical Exam  Constitutional: He appears well-developed and well-nourished.  HENT:  Mouth/Throat: Oropharynx is clear and moist.  Eyes: Conjunctivae are normal. No scleral icterus.  Neck: No thyromegaly present.  Cardiovascular: Normal rate, regular rhythm and normal heart sounds.   No murmur heard. Respiratory: Effort normal and breath sounds normal.  GI: Soft. He exhibits no distension and no mass. There is no tenderness.  Musculoskeletal: Edema: 1+ pitting edema at both legs.  Lymphadenopathy:    He has no cervical adenopathy.  Neurological: He is alert.  Skin: Skin is warm.     Assessment/Plan Solid food dysphagia. EGD with ED.  Jasnoor Trussell U 04/30/2014, 12:41 PM

## 2014-04-30 NOTE — Op Note (Addendum)
EGD PROCEDURE REPORT  PATIENT:  Devin Warner  MR#:  [id number] Birthdate:  25-Nov-1926, 80 y.o., male Endoscopist:  Dr. Rogene Houston, MD Referred By:  Dr. Jasper Loser. Luan Pulling, MD  Procedure Date: 04/30/2014  Procedure:   EGD with ED.  Indications:  The patient is an 79 year old Caucasian male who presents for an over six-month history of dysphagia to solids. He points to the midsternal area as a bolus obstruction. He has chronic GERD and maintain on PPI and has heartburn no more than 2 or 3 times a week. Last esophageal dilation was in June 2007. No structural abnormality noted the esophagus but he did respond to esophageal dilation.            Informed Consent:  The risks, benefits, alternatives & imponderables which include, but are not limited to, bleeding, infection, perforation, drug reaction and potential missed lesion have been reviewed.  The potential for biopsy, lesion removal, esophageal dilation, etc. have also been discussed.  Questions have been answered.  All parties agreeable.  Please see history & physical in medical record for more information.  Medications:  Demerol 25 mg IV Versed 3 mg IV Cetacaine spray topically for oropharyngeal anesthesia  Description of procedure:  The endoscope was introduced through the mouth and advanced to the second portion of the duodenum without difficulty or limitations. The mucosal surfaces were surveyed very carefully during advancement of the scope and upon withdrawal.  Findings:  Esophagus:  Mucosa of the esophagus was normal. GE junction was unremarkable without ring or stricture formation. GEJ:  38 cm Hiatus:  40 cm Stomach:  Stomach was empty other than small amount of bile in it. There was no foreign debris. It distended very well with insufflation. Folds in the proximal stomach were normal. Examination of mucosa at body, antrum, pyloric channel, angularis fundus and cardia was normal. Duodenum:  Normal bulbar and post bulbar  mucosa.  Therapeutic/Diagnostic Maneuvers Performed:   Esophagus dilated by passing 65 French Maloney dilator to full insertion. Endoscope was passed again once the dilator was removed. No mucosal disruption noted to esophagus.  Complications:  None  Impression: Small sliding hiatal hernia otherwise normal EGD. Esophagus dilated by passing 56 French Maloney dilator but no mucosal disruption induced.  Comment; He possibly has esophageal motility disorder causing dysphagia. If dysphagia becomes intractable he will need esophageal manometry to determine the best course of therapy.  Recommendations:  Standard instructions given. Patient advised to chew food thoroughly eat slowly. Patient will call with progress report in one week.  Niels Cranshaw U  04/30/2014  1:02 PM  CC: Dr. Alonza Bogus, MD & Dr. Rayne Du ref. provider found

## 2014-04-30 NOTE — Discharge Instructions (Signed)
Resume usual medications and diet. Remember to chew food thoroughly and eats slowly. No driving for 24 hours. Please call office with progress report next week.  Gastrointestinal Endoscopy, Care After Refer to this sheet in the next few weeks. These instructions provide you with information on caring for yourself after your procedure. Your caregiver may also give you more specific instructions. Your treatment has been planned according to current medical practices, but problems sometimes occur. Call your caregiver if you have any problems or questions after your procedure. HOME CARE INSTRUCTIONS  If you were given medicine to help you relax (sedative), do not drive, operate machinery, or sign important documents for 24 hours.  Avoid alcohol and hot or warm beverages for the first 24 hours after the procedure.  Only take over-the-counter or prescription medicines for pain, discomfort, or fever as directed by your caregiver. You may resume taking your normal medicines unless your caregiver tells you otherwise. Ask your caregiver when you may resume taking medicines that may cause bleeding, such as aspirin, clopidogrel, or warfarin.  You may return to your normal diet and activities on the day after your procedure, or as directed by your caregiver. Walking may help to reduce any bloated feeling in your abdomen.  Drink enough fluids to keep your urine clear or pale yellow.  You may gargle with salt water if you have a sore throat. SEEK IMMEDIATE MEDICAL CARE IF:  You have severe nausea or vomiting.  You have severe abdominal pain, abdominal cramps that last longer than 6 hours, or abdominal swelling (distention).  You have severe shoulder or back pain.  You have trouble swallowing.  You have shortness of breath, your breathing is shallow, or you are breathing faster than normal.  You have a fever or a rapid heartbeat.  You vomit blood or material that looks like coffee grounds.  You  have bloody, black, or tarry stools. MAKE SURE YOU:  Understand these instructions.  Will watch your condition.  Will get help right away if you are not doing well or get worse. Document Released: 03/22/2004 Document Revised: 12/23/2013 Document Reviewed: 11/08/2011 [facility] Patient Information 2015 [place], [place]. This information is not intended to replace advice given to you by your health care provider. Make sure you discuss any questions you have with your health care provider.

## 2014-05-02 ENCOUNTER — Encounter (HOSPITAL_COMMUNITY): Payer: Self-pay | Admitting: Internal Medicine

## 2014-05-08 ENCOUNTER — Ambulatory Visit (INDEPENDENT_AMBULATORY_CARE_PROVIDER_SITE_OTHER): Payer: Medicare Other | Admitting: Orthopedic Surgery

## 2014-05-08 VITALS — BP 138/74 | Ht 70.0 in | Wt 194.0 lb

## 2014-05-08 DIAGNOSIS — S43429A Sprain of unspecified rotator cuff capsule, initial encounter: Secondary | ICD-10-CM

## 2014-05-08 DIAGNOSIS — M75101 Unspecified rotator cuff tear or rupture of right shoulder, not specified as traumatic: Secondary | ICD-10-CM

## 2014-05-08 NOTE — Progress Notes (Signed)
Chief Complaint  Patient presents with  . Shoulder Pain    right shoulder pain, requests injection    Procedure note the subacromial injection shoulder right   Verbal consent was obtained to inject the  Right   Shoulder  Timeout was completed to confirm the injection site is a subacromial space of the  right shoulder   Medication used Depo-Medrol 40 mg and lidocaine 1% 3 cc  Anesthesia was provided by ethyl chloride  The injection was performed in the right posterior subacromial space. After pinning the skin with alcohol and anesthetized the skin with ethyl chloride the subacromial space was injected using a 20-gauge needle. There were no complications  Sterile dressing was applied.

## 2014-05-09 ENCOUNTER — Telehealth (INDEPENDENT_AMBULATORY_CARE_PROVIDER_SITE_OTHER): Payer: Self-pay | Admitting: *Deleted

## 2014-05-09 NOTE — Telephone Encounter (Signed)
Progress report forwarded to Dr. Laural Golden.

## 2014-05-09 NOTE — Telephone Encounter (Signed)
Progress Report - He is doing better but still having a little trouble with his swallowing. Their return phone number is [phone]

## 2014-05-09 NOTE — Telephone Encounter (Signed)
Patient's call returned. His falling has improved significantly but not 100%. He will call if dysphagia gets worse.

## 2014-07-30 ENCOUNTER — Emergency Department (HOSPITAL_COMMUNITY): Payer: Medicare Other

## 2014-07-30 ENCOUNTER — Encounter (HOSPITAL_COMMUNITY): Payer: Self-pay | Admitting: *Deleted

## 2014-07-30 ENCOUNTER — Emergency Department (HOSPITAL_COMMUNITY)
Admission: EM | Admit: 2014-07-30 | Discharge: 2014-07-30 | Disposition: A | Discharge status: Home / Self Care | Payer: Medicare Other | Attending: Emergency Medicine | Admitting: Emergency Medicine

## 2014-07-30 DIAGNOSIS — Z87442 Personal history of urinary calculi: Secondary | ICD-10-CM | POA: Insufficient documentation

## 2014-07-30 DIAGNOSIS — Y9389 Activity, other specified: Secondary | ICD-10-CM | POA: Diagnosis not present

## 2014-07-30 DIAGNOSIS — M1990 Unspecified osteoarthritis, unspecified site: Secondary | ICD-10-CM | POA: Insufficient documentation

## 2014-07-30 DIAGNOSIS — S0990XA Unspecified injury of head, initial encounter: Secondary | ICD-10-CM | POA: Diagnosis present

## 2014-07-30 DIAGNOSIS — Y92091 Bathroom in other non-institutional residence as the place of occurrence of the external cause: Secondary | ICD-10-CM | POA: Insufficient documentation

## 2014-07-30 DIAGNOSIS — I2510 Atherosclerotic heart disease of native coronary artery without angina pectoris: Secondary | ICD-10-CM | POA: Insufficient documentation

## 2014-07-30 DIAGNOSIS — W19XXXA Unspecified fall, initial encounter: Secondary | ICD-10-CM

## 2014-07-30 DIAGNOSIS — S0083XA Contusion of other part of head, initial encounter: Secondary | ICD-10-CM | POA: Insufficient documentation

## 2014-07-30 DIAGNOSIS — W01198A Fall on same level from slipping, tripping and stumbling with subsequent striking against other object, initial encounter: Secondary | ICD-10-CM | POA: Insufficient documentation

## 2014-07-30 DIAGNOSIS — Y998 Other external cause status: Secondary | ICD-10-CM | POA: Insufficient documentation

## 2014-07-30 DIAGNOSIS — S0093XA Contusion of unspecified part of head, initial encounter: Secondary | ICD-10-CM

## 2014-07-30 DIAGNOSIS — Z7982 Long term (current) use of aspirin: Secondary | ICD-10-CM | POA: Diagnosis not present

## 2014-07-30 DIAGNOSIS — Z79899 Other long term (current) drug therapy: Secondary | ICD-10-CM | POA: Diagnosis not present

## 2014-07-30 DIAGNOSIS — S60221A Contusion of right hand, initial encounter: Secondary | ICD-10-CM

## 2014-07-30 LAB — CBC WITH DIFFERENTIAL/PLATELET
BASOS ABS: 0 10*3/uL (ref 0.0–0.1)
BASOS PCT: 0 % (ref 0–1)
Eosinophils Absolute: 0.2 10*3/uL (ref 0.0–0.7)
Eosinophils Relative: 4 % (ref 0–5)
HCT: 35.1 % — ABNORMAL LOW (ref 39.0–52.0)
Hemoglobin: 11 g/dL — ABNORMAL LOW (ref 13.0–17.0)
Lymphocytes Relative: 17 % (ref 12–46)
Lymphs Abs: 1.1 10*3/uL (ref 0.7–4.0)
MCH: 30.4 pg (ref 26.0–34.0)
MCHC: 31.3 g/dL (ref 30.0–36.0)
MCV: 97 fL (ref 78.0–100.0)
Monocytes Absolute: 0.6 10*3/uL (ref 0.1–1.0)
Monocytes Relative: 9 % (ref 3–12)
NEUTROS ABS: 4.5 10*3/uL (ref 1.7–7.7)
NEUTROS PCT: 70 % (ref 43–77)
Platelets: 168 10*3/uL (ref 150–400)
RBC: 3.62 MIL/uL — ABNORMAL LOW (ref 4.22–5.81)
RDW: 13.7 % (ref 11.5–15.5)
WBC: 6.4 10*3/uL (ref 4.0–10.5)

## 2014-07-30 LAB — TROPONIN I: Troponin I: <0.3 ng/mL (ref <0.30)

## 2014-07-30 LAB — BASIC METABOLIC PANEL
ANION GAP: 12 (ref 5–15)
BUN: 34 mg/dL — AB (ref 6–23)
CHLORIDE: 101 meq/L (ref 96–112)
CO2: 29 mEq/L (ref 19–32)
Calcium: 10.2 mg/dL (ref 8.4–10.5)
Creatinine, Ser: 2.32 mg/dL — ABNORMAL HIGH (ref 0.50–1.35)
GFR calc non Af Amer: 24 mL/min — ABNORMAL LOW (ref >90)
GFR, EST AFRICAN AMERICAN: 28 mL/min — AB (ref >90)
Glucose, Bld: 108 mg/dL — ABNORMAL HIGH (ref 70–99)
Potassium: 4.2 mEq/L (ref 3.7–5.3)
Sodium: 142 mEq/L (ref 137–147)

## 2014-07-30 NOTE — ED Provider Notes (Addendum)
CSN: [id number]     Arrival date & time 07/30/14  1021 History  This chart was scribed for Nat Christen, MD by Stephania Fragmin, ED Scribe. This patient was seen in room APA07/[facility]07 and the patient's care was started at 11:22 AM.    Chief Complaint  Patient presents with  . Head Injury  . Loss of Consciousness   The history is provided by the patient. No language interpreter was used.     HPI Comments: Devin Warner is a 80 y.o. male who presents to the Emergency Department for an accidental mechanical fall that occurred last night. Patient states that he hit his head on the bathtub, and his wife notes bruising on his right hand. No loss of consciousness, neurological deficits, neck pain. He has not been ill lately. Severity is mild. Nothing makes symptoms better or worse.  Past Medical History  Diagnosis Date  . Coronary atherosclerosis of native coronary artery   . Syncope and collapse   . Spinal stenosis, lumbar region, without neurogenic claudication   . Arthritis   . Kidney stones    Past Surgical History  Procedure Laterality Date  . Esophagogastroduodenoscopy  02/17/2006    with esophageal dilation followed by colonscopy   . Hemicolectomy  04/03/2002  . 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  10/09/01    Surgeon Laurita Quint   . Back surgery    . Cataract extraction    . Coronary artery bypass graft    . Esophagogastroduodenoscopy N/A 04/30/2014    Procedure: ESOPHAGOGASTRODUODENOSCOPY (EGD);  Surgeon: Rogene Houston, MD;  Location: [facility];  Service: Endoscopy;  Laterality: N/A;  255-moved to 100 Ann to notify pt  . Balloon dilation N/A 04/30/2014    Procedure: BALLOON DILATION;  Surgeon: Rogene Houston, MD;  Location: [facility];  Service: Endoscopy;  Laterality: N/A;  Venia Minks dilation N/A 04/30/2014    Procedure: Venia Minks DILATION;  Surgeon: Rogene Houston, MD;  Location: [facility];  Service: Endoscopy;  Laterality: N/A;  . Savory dilation N/A 04/30/2014     Procedure: SAVORY DILATION;  Surgeon: Rogene Houston, MD;  Location: [facility];  Service: Endoscopy;  Laterality: N/A;   Family History  Problem Relation Age of Onset  . Coronary artery disease      family history   . Arthritis    . Cancer     History  Substance Use Topics  . Smoking status: Never Smoker   . Smokeless tobacco: Not on file  . Alcohol Use: No    Review of Systems  Skin: Positive for wound.  Psychiatric/Behavioral: Negative for confusion.  All other systems reviewed and are negative.     Allergies  Morphine  Home Medications   Prior to Admission medications   Medication Sig Start Date End Date Taking? Authorizing Provider  ALPRAZolam Duanne Moron) 0.5 MG tablet Take 0.5 mg by mouth 4 (four) times daily as needed for anxiety.    Yes Historical Provider, MD  aspirin (ASPIRIN LOW DOSE) 81 MG EC tablet Take 81 mg by mouth daily.     Yes Historical Provider, MD  fenofibrate micronized (LOFIBRA) 134 MG capsule Take 134 mg by mouth daily.  02/21/11  Yes Historical Provider, MD  furosemide (LASIX) 40 MG tablet Take 40 mg by mouth daily.  06/29/12  Yes Historical Provider, MD  loperamide (ANTI-DIARRHEAL) 2 MG tablet Take 2 mg by mouth 4 (four) times daily as needed for diarrhea or loose stools.  Yes Historical Provider, MD  metoprolol tartrate (LOPRESSOR) 25 MG tablet Take 25 mg by mouth every evening.    Yes Historical Provider, MD  Multiple Vitamin (MULTIVITAMIN) tablet Take 1 tablet by mouth daily.     Yes Historical Provider, MD  NAMENDA XR 28 MG CP24 Take 1 capsule by mouth daily. 07/04/14  Yes Historical Provider, MD  omega-3 acid ethyl esters (LOVAZA) 1 G capsule Take 1 g by mouth 2 (two) times daily.    Yes Historical Provider, MD  oxyCODONE-acetaminophen (PERCOCET) 10-325 MG per tablet Take 1 tablet by mouth 4 (four) times daily.   Yes Historical Provider, MD  pantoprazole (PROTONIX) 40 MG tablet Take 40 mg by mouth daily.   Yes Historical Provider, MD  Tamsulosin  HCl (FLOMAX) 0.4 MG CAPS Take 0.4 mg by mouth daily.     Yes Historical Provider, MD  vitamin B-12 (CYANOCOBALAMIN) 1000 MCG tablet Take 1,000 mcg by mouth daily.    Yes Historical Provider, MD  acetaminophen (TYLENOL) 500 MG tablet Take 500 mg by mouth every 6 (six) hours as needed for mild pain.    Historical Provider, MD  traMADol (ULTRAM) 50 MG tablet Take 50 mg by mouth daily as needed for moderate pain.     Historical Provider, MD   BP 146/88 mmHg  Pulse 104  Temp(Src) 97.8 F (36.6 C) (Oral)  Resp 16  Ht 5\' 11"  (1.803 m)  Wt 194 lb (87.998 kg)  BMI 27.07 kg/m2  SpO2 94% Physical Exam  Constitutional: He is oriented to person, place, and time. He appears well-developed and well-nourished.  HENT:  Head: Normocephalic and atraumatic.  Eyes: Conjunctivae and EOM are normal. Pupils are equal, round, and reactive to light.  Neck: Normal range of motion. Neck supple.  Cardiovascular: Normal rate, regular rhythm and normal heart sounds.   Pulmonary/Chest: Effort normal and breath sounds normal.  Abdominal: Soft. Bowel sounds are normal.  Musculoskeletal: Normal range of motion.  Neurological: He is alert and oriented to person, place, and time.  Skin: Skin is warm and dry.  Ecchymosis on thenar eminence on left hand. Abrasion on left forehead.  Psychiatric: He has a normal mood and affect. His behavior is normal.  Nursing note and vitals reviewed.   ED Course  Procedures (including critical care time)  DIAGNOSTIC STUDIES: Oxygen Saturation is  97% on room air, normal by my interpretation.    COORDINATION OF CARE: 11:25 AM - Discussed treatment plan with pt at bedside which includes left hand and head XR and pt agreed to plan.   Labs Review Labs Reviewed  CBC WITH DIFFERENTIAL - Abnormal; Notable for the following:    RBC 3.62 (*)    Hemoglobin 11.0 (*)    HCT 35.1 (*)    All other components within normal limits  BASIC METABOLIC PANEL - Abnormal; Notable for the  following:    Glucose, Bld 108 (*)    BUN 34 (*)    Creatinine, Ser 2.32 (*)    GFR calc non Af Amer 24 (*)    GFR calc Af Amer 28 (*)    All other components within normal limits  TROPONIN I    Imaging Review Ct Head Wo Contrast  07/30/2014   CLINICAL DATA:  Head contusion after fall in bathroom.  EXAM: CT HEAD WITHOUT CONTRAST  TECHNIQUE: Contiguous axial images were obtained from the base of the skull through the vertex without intravenous contrast.  COMPARISON:  CT scan of July 29, 2009.  FINDINGS: Bony calvarium appears intact. Left frontal scalp hematoma is noted. Mild diffuse cortical atrophy is noted. Mild chronic ischemic white matter disease is noted. No mass effect or midline shift is noted. Ventricular size is within normal limits. There is no evidence of mass lesion, hemorrhage or acute infarction.  IMPRESSION: Small left frontal scalp hematoma. Mild diffuse cortical atrophy. Mild chronic ischemic white matter disease. No acute intracranial abnormality seen.   Electronically Signed   By: Sabino Dick M.D.   On: 07/30/2014 12:48   Dg Hand Complete Right  07/30/2014   CLINICAL DATA:  Right hand pain following fall in shower this a.m. Initial evaluation.  EXAM: RIGHT HAND - COMPLETE 3+ VIEW  COMPARISON:  None.  FINDINGS: Diffuse degenerative change. No evidence of fracture or dislocation. Peripheral vascular calcification present.  IMPRESSION: 1. No acute abnormality. 2. Diffuse degenerative change. 3. Peripheral vascular disease.   Electronically Signed   By: Marcello Moores  Register   On: 07/30/2014 12:29     EKG Interpretation None      Date: 07/30/2014  Rate: 91  Rhythm: normal sinus rhythm  QRS Axis: normal  Intervals: normal  ST/T Wave abnormalities: normal  Conduction Disutrbances: PAC  Narrative Interpretation: unremarkable    MDM   Final diagnoses:  Head contusion  Fall, initial encounter  Hand contusion, right, initial encounter   patient is alert. CT head and  right hand films were negative for acute anomalies. Discussed with patient and his wife. Symptomatic treatment only.   I personally performed the services described in this documentation, which was scribed in my presence. The recorded information has been reviewed and is accurate.         Nat Christen, MD 07/30/14 1327  Nat Christen, MD 07/30/14 [id number]

## 2014-07-30 NOTE — ED Notes (Signed)
Pt fell in the bathroom during the night and does not remember the fall. States his head must have hit the bathtub. Abrasion to left forehead. Bruising to left hand/ thumb area.

## 2014-07-30 NOTE — ED Notes (Signed)
Pt able to stand at bedside and use urinal.  C/o pain to right thumb.  Pt no distress.

## 2014-07-30 NOTE — Discharge Instructions (Signed)
X-rays were normal. You'll be sore for several days. Ice pack.

## 2014-12-22 ENCOUNTER — Ambulatory Visit (INDEPENDENT_AMBULATORY_CARE_PROVIDER_SITE_OTHER): Payer: PPO | Admitting: Cardiovascular Disease

## 2014-12-22 ENCOUNTER — Encounter: Payer: Self-pay | Admitting: Cardiovascular Disease

## 2014-12-22 VITALS — BP 126/72 | HR 58 | Ht 66.0 in | Wt 205.2 lb

## 2014-12-22 DIAGNOSIS — I209 Angina pectoris, unspecified: Secondary | ICD-10-CM

## 2014-12-22 DIAGNOSIS — I25768 Atherosclerosis of bypass graft of coronary artery of transplanted heart with other forms of angina pectoris: Secondary | ICD-10-CM

## 2014-12-22 DIAGNOSIS — R600 Localized edema: Secondary | ICD-10-CM

## 2014-12-22 DIAGNOSIS — Z951 Presence of aortocoronary bypass graft: Secondary | ICD-10-CM | POA: Diagnosis not present

## 2014-12-22 MED ORDER — POTASSIUM CHLORIDE CRYS ER 20 MEQ PO TBCR: 20.0000 meq | EXTENDED_RELEASE_TABLET | Freq: Every day | ORAL | Status: DC

## 2014-12-22 MED ORDER — FUROSEMIDE 40 MG PO TABS: ORAL_TABLET | ORAL | Status: DC

## 2014-12-22 NOTE — Progress Notes (Signed)
Patient ID: Devin Warner, male   DOB: 1926-11-28, 81 y.o.   MRN: [id number]      SUBJECTIVE: The patient is an 81 year old male who I am meeting for the first time today. He last saw Dr. Verl Blalock in 2013. He has a history of coronary artery disease and underwent CABG in 2003. His wife says he has been having chest pain more recently and has been taking more nitroglycerin recently. He has also been having more leg swelling, particularly of the right leg. He has bad right shoulder pain and his daughter says he has a torn rotator cuff which will not be operated on.   Review of Systems: As per "subjective", otherwise negative.  Allergies  Allergen Reactions  . Morphine Hives, Anxiety and Rash    PT STATES MORPHINE MAKES HIM "WILD"    Current Outpatient Prescriptions  Medication Sig Dispense Refill  . acetaminophen (TYLENOL) 500 MG tablet Take 500 mg by mouth every 6 (six) hours as needed for mild pain.    Marland Kitchen ALPRAZolam (XANAX) 0.5 MG tablet Take 0.5 mg by mouth 4 (four) times daily as needed for anxiety.     Marland Kitchen aspirin (ASPIRIN LOW DOSE) 81 MG EC tablet Take 81 mg by mouth daily.      . fenofibrate micronized (LOFIBRA) 134 MG capsule Take 134 mg by mouth daily.     . furosemide (LASIX) 40 MG tablet Take 40 mg by mouth daily.     Marland Kitchen lidocaine (LIDODERM) 5 %   5  . loperamide (ANTI-DIARRHEAL) 2 MG tablet Take 2 mg by mouth 4 (four) times daily as needed for diarrhea or loose stools.    . metoprolol tartrate (LOPRESSOR) 25 MG tablet Take 25 mg by mouth every evening.     . Multiple Vitamin (MULTIVITAMIN) tablet Take 1 tablet by mouth daily.      Marland Kitchen NAMENDA XR 28 MG CP24 Take 1 capsule by mouth daily.  12  . NITROSTAT 0.4 MG SL tablet   12  . omega-3 acid ethyl esters (LOVAZA) 1 G capsule Take 1 g by mouth 2 (two) times daily.     Marland Kitchen oxyCODONE-acetaminophen (PERCOCET) 10-325 MG per tablet Take 1 tablet by mouth 4 (four) times daily.    . pantoprazole (PROTONIX) 40 MG tablet Take 40 mg by mouth daily.     . Tamsulosin HCl (FLOMAX) 0.4 MG CAPS Take 0.4 mg by mouth daily.      . traMADol (ULTRAM) 50 MG tablet Take 50 mg by mouth daily as needed for moderate pain.     . vitamin B-12 (CYANOCOBALAMIN) 1000 MCG tablet Take 1,000 mcg by mouth daily.      No current facility-administered medications for this visit.    Past Medical History  Diagnosis Date  . Coronary atherosclerosis of native coronary artery   . Syncope and collapse   . Spinal stenosis, lumbar region, without neurogenic claudication   . Arthritis   . Kidney stones     Past Surgical History  Procedure Laterality Date  . Esophagogastroduodenoscopy  02/17/2006    with esophageal dilation followed by colonscopy   . Hemicolectomy  04/03/2002  . 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  10/09/01    Surgeon Laurita Quint   . Back surgery    . Cataract extraction    . Coronary artery bypass graft    . Esophagogastroduodenoscopy N/A 04/30/2014    Procedure: ESOPHAGOGASTRODUODENOSCOPY (EGD);  Surgeon: Rogene Houston, MD;  [facility];  Service: Endoscopy;  Laterality: N/A;  255-moved to 100 Ann to notify pt  . Balloon dilation N/A 04/30/2014    Procedure: BALLOON DILATION;  Surgeon: Rogene Houston, MD;  [facility];  Service: Endoscopy;  Laterality: N/A;  Venia Minks dilation N/A 04/30/2014    Procedure: Venia Minks DILATION;  Surgeon: Rogene Houston, MD;  [facility];  Service: Endoscopy;  Laterality: N/A;  . Savory dilation N/A 04/30/2014    Procedure: SAVORY DILATION;  Surgeon: Rogene Houston, MD;  [facility];  Service: Endoscopy;  Laterality: N/A;    History   Social History  . Marital Status: Married    Spouse Name: N/A  . Number of Children: N/A  . Years of Education: N/A   Occupational History  . Not on file.   Social History Main Topics  . Smoking status: Former Smoker    Types: Cigarettes, Cigars, Pipe    Start date: 08/22/1940    Quit date: 08/22/1974  .  Smokeless tobacco: Not on file  . Alcohol Use: No  . Drug Use: No  . Sexual Activity: Not on file   Other Topics Concern  . Not on file   Social History Narrative     Filed Vitals:   12/22/14 1553  BP: 126/72  Pulse: 58  Height: 5\' 6"  (1.676 m)  Weight: 205 lb 3.2 oz (93.078 kg)  SpO2: 99%    PHYSICAL EXAM General: NAD, elderly, kyphotic HEENT: Normal. Neck: No JVD, no thyromegaly. Lungs: Clear to auscultation bilaterally with normal respiratory effort. CV: Nondisplaced PMI.  Regular rate and rhythm, normal S1/S2, no S3/S4, no murmur. 1+ pitting pretibial and periankle edema.   Abdomen: Soft, nontender, no distention.  Neurologic: Alert.  Psych: Normal affect. Skin: Normal. Musculoskeletal: Kyphosis. Extremities: No clubbing or cyanosis.   ECG: Most recent ECG reviewed.      ASSESSMENT AND PLAN: 1. Chest pain with CAD and CABG in 2003: As it has been several years since his bypass surgery, I will obtain a Lexiscan Cardiolite to evaluate for ischemia. I will also obtain an echocardiogram to evaluate left ventricular systolic function given both chest pain and leg swelling. Continue aspirin and metoprolol.  2. Leg swelling: I will obtain an echocardiogram to evaluate left ventricular function. I will increase Lasix to 40 mg every morning and 20 mg every evening. I will provide supplemental potassium chloride 20 once daily and check a basic metabolic panel this Friday.  Dispo: f/u 1 month.  Kate Sable, M.D., F.A.C.C.

## 2014-12-22 NOTE — Patient Instructions (Signed)
Your physician recommends that you schedule a follow-up appointment in: 1 month   INCREASE Lasix to 40 mg in the am, and 20 mg in the pm  START Potassium 20 meq daily in the morning  Please get lab work on Friday 12/26/14   Your physician has requested that you have an echocardiogram. Echocardiography is a painless test that uses sound waves to create images of your heart. It provides your doctor with information about the size and shape of your heart and how well your heart's chambers and valves are working. This procedure takes approximately one hour. There are no restrictions for this procedure.   Your physician has requested that you have a lexiscan myoview. For further information please visit [email or web address]. Please follow instruction sheet, as given.      Thank you for choosing [facility] !

## 2014-12-26 ENCOUNTER — Other Ambulatory Visit: Payer: Self-pay | Admitting: Cardiovascular Disease

## 2014-12-26 DIAGNOSIS — R079 Chest pain, unspecified: Secondary | ICD-10-CM

## 2014-12-27 LAB — BASIC METABOLIC PANEL
BUN: 30 mg/dL — AB (ref 6–23)
CHLORIDE: 100 meq/L (ref 96–112)
CO2: 31 mEq/L (ref 19–32)
Calcium: 10.1 mg/dL (ref 8.4–10.5)
Creat: 2.13 mg/dL — ABNORMAL HIGH (ref 0.50–1.35)
Glucose, Bld: 91 mg/dL (ref 70–99)
Potassium: 4.4 mEq/L (ref 3.5–5.3)
Sodium: 138 mEq/L (ref 135–145)

## 2014-12-30 ENCOUNTER — Encounter (HOSPITAL_COMMUNITY): Payer: PPO

## 2014-12-31 ENCOUNTER — Encounter (HOSPITAL_COMMUNITY)
Admission: RE | Admit: 2014-12-31 | Discharge: 2014-12-31 | Disposition: A | Discharge status: Home / Self Care | Payer: PPO | Source: Ambulatory Visit | Attending: Cardiovascular Disease | Admitting: Cardiovascular Disease

## 2014-12-31 ENCOUNTER — Ambulatory Visit (HOSPITAL_COMMUNITY)
Admission: RE | Admit: 2014-12-31 | Discharge: 2014-12-31 | Disposition: A | Discharge status: Home / Self Care | Payer: PPO | Source: Ambulatory Visit | Attending: Cardiovascular Disease | Admitting: Cardiovascular Disease

## 2014-12-31 ENCOUNTER — Encounter (HOSPITAL_COMMUNITY): Payer: Self-pay

## 2014-12-31 DIAGNOSIS — R079 Chest pain, unspecified: Secondary | ICD-10-CM | POA: Insufficient documentation

## 2014-12-31 DIAGNOSIS — I209 Angina pectoris, unspecified: Secondary | ICD-10-CM | POA: Insufficient documentation

## 2014-12-31 DIAGNOSIS — Z951 Presence of aortocoronary bypass graft: Secondary | ICD-10-CM

## 2014-12-31 DIAGNOSIS — R600 Localized edema: Secondary | ICD-10-CM | POA: Diagnosis not present

## 2014-12-31 HISTORY — DX: Essential (primary) hypertension: I10

## 2014-12-31 HISTORY — DX: Malignant (primary) neoplasm, unspecified: C80.1

## 2014-12-31 LAB — NM MYOCAR MULTI W/SPECT W/WALL MOTION / EF
CHL CUP NUCLEAR SRS: 0
CHL CUP NUCLEAR SSS: 2
LV dias vol: 97 mL
LVSYSVOL: 39 mL
NUC STRESS EF: 60 %
Peak HR: 85 {beats}/min
RATE: 0.37
Rest HR: 64 {beats}/min
SDS: 2
TID: 0.92

## 2014-12-31 MED ORDER — TECHNETIUM TC 99M SESTAMIBI - CARDIOLITE
10.0000 | Freq: Once | INTRAVENOUS | Status: AC | PRN
  Administered 2014-12-31: 10 via INTRAVENOUS

## 2014-12-31 MED ORDER — TECHNETIUM TC 99M SESTAMIBI GENERIC - CARDIOLITE
30.0000 | Freq: Once | INTRAVENOUS | Status: AC | PRN
Start: 2014-12-31 — End: 2014-12-31
  Administered 2014-12-31: 29 via INTRAVENOUS

## 2014-12-31 MED ORDER — REGADENOSON 0.4 MG/5ML IV SOLN
0.4000 mg | Freq: Once | INTRAVENOUS | Status: AC | PRN
  Administered 2014-12-31: 0.4 mg via INTRAVENOUS
  Filled 2014-12-31: qty 5

## 2014-12-31 MED ORDER — REGADENOSON 0.4 MG/5ML IV SOLN
INTRAVENOUS | Status: AC
  Filled 2014-12-31: qty 5

## 2014-12-31 MED ORDER — SODIUM CHLORIDE 0.9 % IJ SOLN
10.0000 mL | INTRAMUSCULAR | Status: DC | PRN
  Administered 2014-12-31: 10 mL via INTRAVENOUS
  Filled 2014-12-31: qty 10

## 2014-12-31 MED ORDER — SODIUM CHLORIDE 0.9 % IJ SOLN
INTRAMUSCULAR | Status: AC
  Filled 2014-12-31: qty 3

## 2015-01-29 ENCOUNTER — Ambulatory Visit (INDEPENDENT_AMBULATORY_CARE_PROVIDER_SITE_OTHER): Payer: PPO | Admitting: Cardiovascular Disease

## 2015-01-29 VITALS — BP 130/72 | HR 74 | Ht 69.0 in | Wt 208.0 lb

## 2015-01-29 DIAGNOSIS — I25768 Atherosclerosis of bypass graft of coronary artery of transplanted heart with other forms of angina pectoris: Secondary | ICD-10-CM

## 2015-01-29 DIAGNOSIS — I209 Angina pectoris, unspecified: Secondary | ICD-10-CM

## 2015-01-29 DIAGNOSIS — R600 Localized edema: Secondary | ICD-10-CM | POA: Diagnosis not present

## 2015-01-29 DIAGNOSIS — Z951 Presence of aortocoronary bypass graft: Secondary | ICD-10-CM | POA: Diagnosis not present

## 2015-01-29 NOTE — Progress Notes (Signed)
Patient ID: Devin Warner, male   DOB: 08-Jun-1927, 81 y.o.   MRN: [id number]      SUBJECTIVE: The patient returns for follow-up after undergoing cardiovascular testing performed for the evaluation of chest pain and leg swelling.  Lexiscan Cardiolite stress test was normal.  Echocardiogram showed normal left ventricular systolic function, LVEF 62-13%, with grade 1 diastolic dysfunction, and mild mitral and tricuspid regurgitation with mildly elevated pulmonary pressures. There was biatrial dilatation.  Basic metabolic panel on 0/8/65 showed BUN 30, creatinine 2.13, potassium 4.4, sodium 138.  He is here with his wife and son. He has only had to use nitroglycerin once or twice since his last appointment with me. His primary complaints relate to back pain and shoulder pain.   Review of Systems: As per "subjective", otherwise negative.  Allergies  Allergen Reactions  . Morphine Hives, Anxiety and Rash    PT STATES MORPHINE MAKES HIM "WILD"    Current Outpatient Prescriptions  Medication Sig Dispense Refill  . acetaminophen (TYLENOL) 500 MG tablet Take 500 mg by mouth every 6 (six) hours as needed for mild pain.    Marland Kitchen ALPRAZolam (XANAX) 0.5 MG tablet Take 0.5 mg by mouth 4 (four) times daily as needed for anxiety.     Marland Kitchen aspirin (ASPIRIN LOW DOSE) 81 MG EC tablet Take 81 mg by mouth daily.      . fenofibrate micronized (LOFIBRA) 134 MG capsule Take 134 mg by mouth daily.     . furosemide (LASIX) 40 MG tablet Take 40 mg in the am, and 20 mg (1/2 tablet) in the pm 135 tablet 3  . lidocaine (LIDODERM) 5 %   5  . loperamide (ANTI-DIARRHEAL) 2 MG tablet Take 2 mg by mouth 4 (four) times daily as needed for diarrhea or loose stools.    . metoprolol tartrate (LOPRESSOR) 25 MG tablet Take 25 mg by mouth every evening.     . Multiple Vitamin (MULTIVITAMIN) tablet Take 1 tablet by mouth daily.      Marland Kitchen NAMENDA XR 28 MG CP24 Take 1 capsule by mouth daily.  12  . NITROSTAT 0.4 MG SL tablet   12  .  omega-3 acid ethyl esters (LOVAZA) 1 G capsule Take 1 g by mouth 2 (two) times daily.     Marland Kitchen oxyCODONE-acetaminophen (PERCOCET) 10-325 MG per tablet Take 1 tablet by mouth 4 (four) times daily.    . pantoprazole (PROTONIX) 40 MG tablet Take 40 mg by mouth daily.    . potassium chloride SA (K-DUR,KLOR-CON) 20 MEQ tablet Take 1 tablet (20 mEq total) by mouth daily. 90 tablet 3  . Tamsulosin HCl (FLOMAX) 0.4 MG CAPS Take 0.4 mg by mouth daily.      . traMADol (ULTRAM) 50 MG tablet Take 50 mg by mouth daily as needed for moderate pain.     . vitamin B-12 (CYANOCOBALAMIN) 1000 MCG tablet Take 1,000 mcg by mouth daily.      No current facility-administered medications for this visit.    Past Medical History  Diagnosis Date  . Coronary atherosclerosis of native coronary artery   . Syncope and collapse   . Spinal stenosis, lumbar region, without neurogenic claudication   . Arthritis   . Kidney stones   . Cancer     Mouth and colon  . Hypertension     Past Surgical History  Procedure Laterality Date  . Esophagogastroduodenoscopy  02/17/2006    with esophageal dilation followed by colonscopy   . Hemicolectomy  04/03/2002  .  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  10/09/01    Surgeon Laurita Quint   . Back surgery    . Cataract extraction    . Coronary artery bypass graft    . Esophagogastroduodenoscopy N/A 04/30/2014    Procedure: ESOPHAGOGASTRODUODENOSCOPY (EGD);  Surgeon: Rogene Houston, MD;  [facility];  Service: Endoscopy;  Laterality: N/A;  255-moved to 100 Ann to notify pt  . Balloon dilation N/A 04/30/2014    Procedure: BALLOON DILATION;  Surgeon: Rogene Houston, MD;  [facility];  Service: Endoscopy;  Laterality: N/A;  Venia Minks dilation N/A 04/30/2014    Procedure: Venia Minks DILATION;  Surgeon: Rogene Houston, MD;  [facility];  Service: Endoscopy;  Laterality: N/A;  . Savory dilation N/A 04/30/2014    Procedure: SAVORY DILATION;  Surgeon:  Rogene Houston, MD;  [facility];  Service: Endoscopy;  Laterality: N/A;    History   Social History  . Marital Status: Married    Spouse Name: N/A  . Number of Children: N/A  . Years of Education: N/A   Occupational History  . Not on file.   Social History Main Topics  . Smoking status: Former Smoker    Types: Cigarettes, Cigars, Pipe    Start date: 08/22/1940    Quit date: 08/22/1974  . Smokeless tobacco: Not on file  . Alcohol Use: No  . Drug Use: No  . Sexual Activity: Not on file   Other Topics Concern  . Not on file   Social History Narrative     Filed Vitals:   01/29/15 1028  BP: 130/72  Pulse: 74  Height: 5\' 9"  (1.753 m)  Weight: 208 lb (94.348 kg)  SpO2: 95%    PHYSICAL EXAM General: NAD, elderly, kyphotic HEENT: Normal. Neck: No JVD, no thyromegaly. Lungs: Clear to auscultation bilaterally with normal respiratory effort. CV: Nondisplaced PMI. Regular rate and rhythm, normal S1/S2, no S3/S4, no murmur. 1+ pitting pretibial and periankle edema.  Abdomen: Soft, nontender, no distention.  Neurologic: Alert.  Psych: Normal affect. Skin: Normal. Musculoskeletal: Kyphosis. Extremities: No clubbing or cyanosis.   ECG: Most recent ECG reviewed.    ASSESSMENT AND PLAN: 1. Chest pain with CAD and CABG in 2003: Symptomatically stable. Lexiscan Cardiolite showed no evidence of ischemia. Echo showed normal left ventricular systolic function and grade I diastolic dysfunction as noted above. Continue aspirin and metoprolol.  2. Leg swelling: Echo showed normal left ventricular systolic function and grade I diastolic dysfunction as noted above. Continue Lasix 40 mg every morning and 20 mg every evening. Continue supplemental potassium chloride 20 once daily.  Dispo: f/u 6 months.  Kate Sable, M.D., F.A.C.C.

## 2015-01-29 NOTE — Patient Instructions (Signed)

## 2015-08-07 ENCOUNTER — Ambulatory Visit (INDEPENDENT_AMBULATORY_CARE_PROVIDER_SITE_OTHER): Payer: PPO | Admitting: Cardiovascular Disease

## 2015-08-07 ENCOUNTER — Encounter: Payer: Self-pay | Admitting: Cardiovascular Disease

## 2015-08-07 VITALS — BP 132/74 | HR 84 | Ht 67.0 in | Wt 208.0 lb

## 2015-08-07 DIAGNOSIS — I25810 Atherosclerosis of coronary artery bypass graft(s) without angina pectoris: Secondary | ICD-10-CM | POA: Diagnosis not present

## 2015-08-07 DIAGNOSIS — R600 Localized edema: Secondary | ICD-10-CM

## 2015-08-07 NOTE — Progress Notes (Signed)
Patient ID: Devin Warner, male   DOB: 02/18/1927, 81 y.o.   MRN: [id number]      SUBJECTIVE: The patient presents for follow-up of coronary artery disease with history of CABG as well as leg swelling.  Denies chest pain, shortness of breath, and leg swelling.  His only complaint is "old age" which he said humorously.  ECG performed in the office today demonstrates normal sinus rhythm with no ischemic ST segment or T-wave abnormalities, nor any arrhythmias.   Review of Systems: As per "subjective", otherwise negative.  Allergies  Allergen Reactions  . Morphine Hives, Anxiety and Rash    PT STATES MORPHINE MAKES HIM "WILD"    Current Outpatient Prescriptions  Medication Sig Dispense Refill  . acetaminophen (TYLENOL) 500 MG tablet Take 500 mg by mouth every 6 (six) hours as needed for mild pain.    Marland Kitchen ALPRAZolam (XANAX) 0.5 MG tablet Take 0.5 mg by mouth 4 (four) times daily as needed for anxiety.     Marland Kitchen aspirin (ASPIRIN LOW DOSE) 81 MG EC tablet Take 81 mg by mouth daily.      . fenofibrate micronized (LOFIBRA) 134 MG capsule Take 134 mg by mouth daily.     . furosemide (LASIX) 40 MG tablet Take 40 mg in the am, and 20 mg (1/2 tablet) in the pm 135 tablet 3  . lidocaine (LIDODERM) 5 %   5  . loperamide (ANTI-DIARRHEAL) 2 MG tablet Take 2 mg by mouth 4 (four) times daily as needed for diarrhea or loose stools.    . metoprolol tartrate (LOPRESSOR) 25 MG tablet Take 25 mg by mouth every evening.     . Multiple Vitamin (MULTIVITAMIN) tablet Take 1 tablet by mouth daily.      Marland Kitchen NAMENDA XR 28 MG CP24 Take 1 capsule by mouth daily.  12  . NITROSTAT 0.4 MG SL tablet   12  . omega-3 acid ethyl esters (LOVAZA) 1 G capsule Take 1 g by mouth 2 (two) times daily.     Marland Kitchen oxyCODONE-acetaminophen (PERCOCET) 10-325 MG per tablet Take 1 tablet by mouth 4 (four) times daily.    . pantoprazole (PROTONIX) 40 MG tablet Take 40 mg by mouth daily.    . potassium chloride SA (K-DUR,KLOR-CON) 20 MEQ tablet  Take 1 tablet (20 mEq total) by mouth daily. 90 tablet 3  . Tamsulosin HCl (FLOMAX) 0.4 MG CAPS Take 0.4 mg by mouth daily.      . traMADol (ULTRAM) 50 MG tablet Take 50 mg by mouth daily as needed for moderate pain.     . vitamin B-12 (CYANOCOBALAMIN) 1000 MCG tablet Take 1,000 mcg by mouth daily.      No current facility-administered medications for this visit.    Past Medical History  Diagnosis Date  . Coronary atherosclerosis of native coronary artery   . Syncope and collapse   . Spinal stenosis, lumbar region, without neurogenic claudication   . Arthritis   . Kidney stones   . Cancer (HCC)     Mouth and colon  . Hypertension     Past Surgical History  Procedure Laterality Date  . Esophagogastroduodenoscopy  02/17/2006    with esophageal dilation followed by colonscopy   . Hemicolectomy  04/03/2002  . 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  10/09/01    Surgeon Laurita Quint   . Back surgery    . Cataract extraction    . Coronary artery bypass graft    . Esophagogastroduodenoscopy N/A 04/30/2014  Procedure: ESOPHAGOGASTRODUODENOSCOPY (EGD);  Surgeon: Rogene Houston, MD;  [facility];  Service: Endoscopy;  Laterality: N/A;  255-moved to 100 Ann to notify pt  . Balloon dilation N/A 04/30/2014    Procedure: BALLOON DILATION;  Surgeon: Rogene Houston, MD;  [facility];  Service: Endoscopy;  Laterality: N/A;  Venia Minks dilation N/A 04/30/2014    Procedure: Venia Minks DILATION;  Surgeon: Rogene Houston, MD;  [facility];  Service: Endoscopy;  Laterality: N/A;  . Savory dilation N/A 04/30/2014    Procedure: SAVORY DILATION;  Surgeon: Rogene Houston, MD;  [facility];  Service: Endoscopy;  Laterality: N/A;    Social History   Social History  . Marital Status: Married    Spouse Name: N/A  . Number of Children: N/A  . Years of Education: N/A   Occupational History  . Not on file.   Social History Main Topics  . Smoking  status: Former Smoker    Types: Cigarettes, Cigars, Pipe    Start date: 08/22/1940    Quit date: 08/22/1974  . Smokeless tobacco: Not on file  . Alcohol Use: No  . Drug Use: No  . Sexual Activity: Not on file   Other Topics Concern  . Not on file   Social History Narrative     Filed Vitals:   08/07/15 1106  BP: 132/74  Pulse: 84  Height: 5\' 7"  (1.702 m)  Weight: 208 lb (94.348 kg)  SpO2: 98%    PHYSICAL EXAM General: NAD HEENT: Normal. Neck: No JVD, no thyromegaly. Lungs: Clear to auscultation bilaterally with normal respiratory effort. CV: Nondisplaced PMI.  Regular rate and rhythm, normal S1/S2, no S3/S4, no murmur. No pretibial or periankle edema.  No carotid bruit. Abdomen: Soft, nontender, obese.  Neurologic: Alert and oriented.  Psych: Normal affect. Musculoskeletal: Kyphotic. Extremities: No clubbing or cyanosis.   ECG: Most recent ECG reviewed.      ASSESSMENT AND PLAN: 1. CAD and CABG in 2003: Symptomatically stable. Lexiscan Cardiolite showed no evidence of ischemia on 12/31/14. Echo showed normal left ventricular systolic function and grade I diastolic dysfunction as noted above. Continue aspirin and metoprolol.  2. Leg swelling: Echo showed normal left ventricular systolic function and grade I diastolic dysfunction on 0000000. Continue Lasix 40 mg every morning and 20 mg every evening. Continue supplemental potassium chloride 20 once daily.  Dispo: f/u 1 year.  Kate Sable, M.D., F.A.C.C.

## 2015-08-07 NOTE — Patient Instructions (Signed)
Your physician wants you to follow-up in:  1 year with Dr Koneswaran You will receive a reminder letter in the mail two months in advance. If you don't receive a letter, please call our office to schedule the follow-up appointment.    Your physician recommends that you continue on your current medications as directed. Please refer to the Current Medication list given to you today.    If you need a refill on your cardiac medications before your next appointment, please call your pharmacy.     Thank you for choosing [facility] !        

## 2015-09-28 DIAGNOSIS — M25511 Pain in right shoulder: Secondary | ICD-10-CM | POA: Diagnosis not present

## 2015-09-28 DIAGNOSIS — M1990 Unspecified osteoarthritis, unspecified site: Secondary | ICD-10-CM | POA: Diagnosis not present

## 2015-09-28 DIAGNOSIS — M545 Low back pain: Secondary | ICD-10-CM | POA: Diagnosis not present

## 2015-09-28 DIAGNOSIS — I119 Hypertensive heart disease without heart failure: Secondary | ICD-10-CM | POA: Diagnosis not present

## 2015-11-04 DIAGNOSIS — F0390 Unspecified dementia without behavioral disturbance: Secondary | ICD-10-CM | POA: Diagnosis not present

## 2015-11-04 DIAGNOSIS — M545 Low back pain: Secondary | ICD-10-CM | POA: Diagnosis not present

## 2015-11-04 DIAGNOSIS — M25511 Pain in right shoulder: Secondary | ICD-10-CM | POA: Diagnosis not present

## 2015-11-04 DIAGNOSIS — I2510 Atherosclerotic heart disease of native coronary artery without angina pectoris: Secondary | ICD-10-CM | POA: Diagnosis not present

## 2015-12-15 ENCOUNTER — Other Ambulatory Visit: Payer: Self-pay | Admitting: Cardiovascular Disease

## 2015-12-17 ENCOUNTER — Other Ambulatory Visit: Payer: Self-pay | Admitting: Cardiovascular Disease

## 2015-12-28 ENCOUNTER — Ambulatory Visit (INDEPENDENT_AMBULATORY_CARE_PROVIDER_SITE_OTHER): Payer: PPO | Admitting: Orthopedic Surgery

## 2015-12-28 ENCOUNTER — Ambulatory Visit (INDEPENDENT_AMBULATORY_CARE_PROVIDER_SITE_OTHER): Payer: PPO

## 2015-12-28 VITALS — BP 121/71 | Ht 67.0 in | Wt 208.0 lb

## 2015-12-28 DIAGNOSIS — M1711 Unilateral primary osteoarthritis, right knee: Secondary | ICD-10-CM

## 2015-12-28 DIAGNOSIS — M25561 Pain in right knee: Secondary | ICD-10-CM

## 2015-12-28 NOTE — Patient Instructions (Signed)

## 2015-12-28 NOTE — Progress Notes (Signed)
Patient ID: Devin Warner, male   DOB: Apr 07, 1927, 82 y.o.   MRN: [id number]  Chief Complaint  Patient presents with  . Follow-up    follow up right knee    HPI 82 year old male history of painful right knee no new trauma history of a Skil saw laceration of the dorsal aspect of the knee joint and patella. Complains of pain and swelling.  ROS  No locking but he does have giving way and painful weightbearing  Past Medical History  Diagnosis Date  . Coronary atherosclerosis of native coronary artery   . Syncope and collapse   . Spinal stenosis, lumbar region, without neurogenic claudication   . Arthritis   . Kidney stones   . Cancer (HCC)     Mouth and colon  . Hypertension      BP 121/71 mmHg  Ht 5\' 7"  (1.702 m)  Wt 208 lb (94.348 kg)  BMI 32.57 kg/m2  Physical Exam Physical Exam  Constitutional: The patient is oriented to person, place, and time. The patient appears well-developed and well-nourished. No distress.  Cardiovascular: Peripheral edema  Neurological: The patient is alert and oriented to person, place, and time. The patient exhibits normal muscle tone. Coordination could not be tested Skin: Skin is warm and dry. No rash noted. The patient is not diaphoretic. No erythema. No pallor.  Psychiatric: The patient has a normal mood and affect. Her behavior is normal. Judgment and thought content normal.   Abnormal walking pattern, needs walker   Ortho Exam Right knee   with the exam maybe has trace effusion does have medial joint line tenderness does have varus alignment flexion is 120 knee is stable strength normal skin is intact peripheral edema distally normal sensation    ASSESSMENT AND PLAN  X-RAY SHOWS SEVERE VARUS ARTHRITIS  His medical condition and age prevent any intervention other than an injection is already on Percocet  We injected right knee follow-up as needed

## 2016-01-28 DIAGNOSIS — I119 Hypertensive heart disease without heart failure: Secondary | ICD-10-CM | POA: Diagnosis not present

## 2016-01-28 DIAGNOSIS — F0390 Unspecified dementia without behavioral disturbance: Secondary | ICD-10-CM | POA: Diagnosis not present

## 2016-01-28 DIAGNOSIS — I10 Essential (primary) hypertension: Secondary | ICD-10-CM | POA: Diagnosis not present

## 2016-01-28 DIAGNOSIS — M25561 Pain in right knee: Secondary | ICD-10-CM | POA: Diagnosis not present

## 2016-04-27 ENCOUNTER — Encounter (HOSPITAL_COMMUNITY): Payer: Self-pay | Admitting: Emergency Medicine

## 2016-04-27 ENCOUNTER — Emergency Department (HOSPITAL_COMMUNITY): Payer: PPO

## 2016-04-27 ENCOUNTER — Emergency Department (HOSPITAL_COMMUNITY)
Admission: EM | Admit: 2016-04-27 | Discharge: 2016-04-27 | Disposition: A | Discharge status: Home / Self Care | Payer: PPO | Attending: Emergency Medicine | Admitting: Emergency Medicine

## 2016-04-27 DIAGNOSIS — Z85038 Personal history of other malignant neoplasm of large intestine: Secondary | ICD-10-CM | POA: Diagnosis not present

## 2016-04-27 DIAGNOSIS — Z87891 Personal history of nicotine dependence: Secondary | ICD-10-CM | POA: Insufficient documentation

## 2016-04-27 DIAGNOSIS — R531 Weakness: Secondary | ICD-10-CM | POA: Diagnosis not present

## 2016-04-27 DIAGNOSIS — F0390 Unspecified dementia without behavioral disturbance: Secondary | ICD-10-CM | POA: Insufficient documentation

## 2016-04-27 DIAGNOSIS — R0789 Other chest pain: Secondary | ICD-10-CM | POA: Diagnosis not present

## 2016-04-27 DIAGNOSIS — Z79899 Other long term (current) drug therapy: Secondary | ICD-10-CM | POA: Diagnosis not present

## 2016-04-27 DIAGNOSIS — I2510 Atherosclerotic heart disease of native coronary artery without angina pectoris: Secondary | ICD-10-CM | POA: Diagnosis not present

## 2016-04-27 DIAGNOSIS — R079 Chest pain, unspecified: Secondary | ICD-10-CM | POA: Diagnosis not present

## 2016-04-27 DIAGNOSIS — I10 Essential (primary) hypertension: Secondary | ICD-10-CM | POA: Insufficient documentation

## 2016-04-27 DIAGNOSIS — Z7982 Long term (current) use of aspirin: Secondary | ICD-10-CM | POA: Diagnosis not present

## 2016-04-27 LAB — BASIC METABOLIC PANEL
Anion gap: 9 (ref 5–15)
BUN: 32 mg/dL — AB (ref 6–20)
CO2: 29 mmol/L (ref 22–32)
CREATININE: 2.68 mg/dL — AB (ref 0.61–1.24)
Calcium: 10.2 mg/dL (ref 8.9–10.3)
Chloride: 102 mmol/L (ref 101–111)
GFR calc Af Amer: 23 mL/min — ABNORMAL LOW (ref >60)
GFR, EST NON AFRICAN AMERICAN: 20 mL/min — AB (ref >60)
Glucose, Bld: 100 mg/dL — ABNORMAL HIGH (ref 65–99)
POTASSIUM: 4.1 mmol/L (ref 3.5–5.1)
Sodium: 140 mmol/L (ref 135–145)

## 2016-04-27 LAB — URINALYSIS, ROUTINE W REFLEX MICROSCOPIC
Bilirubin Urine: NEGATIVE
Glucose, UA: NEGATIVE mg/dL
HGB URINE DIPSTICK: NEGATIVE
KETONES UR: NEGATIVE mg/dL
Leukocytes, UA: NEGATIVE
Nitrite: NEGATIVE
Protein, ur: NEGATIVE mg/dL
Specific Gravity, Urine: 1.01 (ref 1.005–1.030)
pH: 6.5 (ref 5.0–8.0)

## 2016-04-27 LAB — I-STAT TROPONIN, ED: Troponin i, poc: 0.01 ng/mL (ref 0.00–0.08)

## 2016-04-27 LAB — CBC
HCT: 34.9 % — ABNORMAL LOW (ref 39.0–52.0)
Hemoglobin: 11.2 g/dL — ABNORMAL LOW (ref 13.0–17.0)
MCH: 30.9 pg (ref 26.0–34.0)
MCHC: 32.1 g/dL (ref 30.0–36.0)
MCV: 96.4 fL (ref 78.0–100.0)
Platelets: 186 10*3/uL (ref 150–400)
RBC: 3.62 MIL/uL — AB (ref 4.22–5.81)
RDW: 14.2 % (ref 11.5–15.5)
WBC: 5.2 10*3/uL (ref 4.0–10.5)

## 2016-04-27 MED ORDER — SODIUM CHLORIDE 0.9 % IV BOLUS (SEPSIS)
500.0000 mL | Freq: Once | INTRAVENOUS | Status: AC
  Administered 2016-04-27: 500 mL via INTRAVENOUS

## 2016-04-27 NOTE — ED Triage Notes (Addendum)
Patient brought in by EMS from Dr Luan Pulling with complaint of chest pain x 2 days. Patient is unsure of medical history and medications. Waiting for family to arrive with information. Per EMS patient received 324 mg ASA and 3 nitro with no relief.

## 2016-04-27 NOTE — ED Notes (Signed)
Spoke with patient's son, he states patient has been complaining of chest pain for "a long time." States pain is worse with eating. States "he has these tremors off and on and he was at the doctor's office today and he saw him having the tremors and wanted him to come to the ER and be checked out." Patient is confused and repeats statements. Son states this is baseline for patient.

## 2016-04-27 NOTE — ED Notes (Signed)
Patient verbalizes understanding of discharge instructions, home care and follow up care. Patient out of department at this time. 

## 2016-04-27 NOTE — Discharge Instructions (Signed)
Tests show no life-threatening condition. Follow-up your primary care doctor. °

## 2016-04-27 NOTE — ED Provider Notes (Signed)
[place] DEPT Provider Note   CSN: [id number] Arrival date & time: 04/27/16  1517     History   Chief Complaint Chief Complaint  Patient presents with  . Chest Pain    HPI JORGE ZAINO is a 82 y.o. male.  Level V caveat for mild dementia. Patient presents today from his primary care office (Dr. Luan Pulling).  His arms were tremulous. Family reports this often happens when he attempts to walk with his walker. He has had chest pain for an extended period of time. Pain is worse with eating. Patient does have an element of confusion, but the family reports this is not unusual. No specific new neurological deficits, chest pain, dyspnea, fever, sweats, chills, dysuria      Past Medical History:  Diagnosis Date  . Arthritis   . Cancer (HCC)    Mouth and colon  . Coronary atherosclerosis of native coronary artery   . Hypertension   . Kidney stones   . Spinal stenosis, lumbar region, without neurogenic claudication   . Syncope and collapse     Patient Active Problem List   Diagnosis Date Noted  . Dysphagia, unspecified(787.20) 03/25/2014  . Rotator cuff tear, right 02/03/2014  . Bursitis, shoulder 08/01/2013  . Arthritis of knee, left 07/04/2012  . OA (osteoarthritis) of knee 05/30/2012  . Effusion of knee joint 05/30/2012  . Knee pain, right 05/30/2012  . WOUND, FINGER 11/08/2010  . CAD, NATIVE VESSEL 01/13/2010  . SPINAL STENOSIS, LUMBAR 01/29/2009    Past Surgical History:  Procedure Laterality Date  . BACK SURGERY    . BALLOON DILATION N/A 04/30/2014   Procedure: BALLOON DILATION;  Surgeon: Rogene Houston, MD;  [facility]: [facility];  Service: Endoscopy;  Laterality: N/A;  . CATARACT EXTRACTION    . CORONARY ARTERY BYPASS GRAFT    . ESOPHAGOGASTRODUODENOSCOPY  02/17/2006   with esophageal dilation followed by colonscopy   . ESOPHAGOGASTRODUODENOSCOPY N/A 04/30/2014   Procedure: ESOPHAGOGASTRODUODENOSCOPY (EGD);  Surgeon: Rogene Houston, MD;  Location: [facility];  Service: Endoscopy;  Laterality: N/A;  255-moved to 100 Ann to notify pt  . HEMICOLECTOMY  04/03/2002  . MALONEY DILATION N/A 04/30/2014   Procedure: Venia Minks DILATION;  Surgeon: Rogene Houston, MD;  Location: [facility];  Service: Endoscopy;  Laterality: N/A;  . 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  10/09/01   Surgeon Laurita Quint   . SAVORY DILATION N/A 04/30/2014   Procedure: SAVORY DILATION;  Surgeon: Rogene Houston, MD;  Location: [facility];  Service: Endoscopy;  Laterality: N/A;       Home Medications    Prior to Admission medications   Medication Sig Start Date End Date Taking? Authorizing Provider  acetaminophen (TYLENOL) 500 MG tablet Take 500-1,000 mg by mouth every 6 (six) hours as needed for mild pain, fever or headache.    Yes Historical Provider, MD  ALPRAZolam Duanne Moron) 0.5 MG tablet Take 0.5 mg by mouth 4 (four) times daily as needed for anxiety.    Yes Historical Provider, MD  aspirin EC 81 MG tablet Take 81 mg by mouth daily.   Yes Historical Provider, MD  fenofibrate micronized (LOFIBRA) 134 MG capsule Take 134 mg by mouth every evening.    Yes Historical Provider, MD  furosemide (LASIX) 20 MG tablet Take 20 mg by mouth every evening.   Yes Historical Provider, MD  furosemide (LASIX) 40 MG tablet Take 40 mg by mouth every morning.   Yes Historical Provider,  MD  hydrocortisone (ANUSOL-HC) 2.5 % rectal cream Place 1 application rectally 2 (two) times daily as needed for hemorrhoids or itching.   Yes Historical Provider, MD  lidocaine (LIDODERM) 5 % Place 1 patch onto the skin daily as needed (for pain). Pt applies to back.   Remove & Discard patch within 12 hours or as directed by MD    Yes Historical Provider, MD  metoprolol tartrate (LOPRESSOR) 25 MG tablet Take 25 mg by mouth every evening.    Yes Historical Provider, MD  Multiple Vitamin (MULTIVITAMIN WITH MINERALS) TABS tablet Take 1 tablet by mouth daily with lunch.   Yes Historical Provider,  MD  mupirocin ointment (BACTROBAN) 2 % Apply 1 application topically 2 (two) times daily as needed (for sores).   Yes Historical Provider, MD  NAMENDA XR 28 MG CP24 Take 28 mg by mouth at bedtime.    Yes Historical Provider, MD  nitroGLYCERIN (NITROSTAT) 0.4 MG SL tablet Place 0.4 mg under the tongue every 5 (five) minutes as needed for chest pain.   Yes Historical Provider, MD  omega-3 acid ethyl esters (LOVAZA) 1 G capsule Take 1 g by mouth 2 (two) times daily.    Yes Historical Provider, MD  oxyCODONE-acetaminophen (PERCOCET) 10-325 MG per tablet Take 1 tablet by mouth every 6 (six) hours as needed for pain.    Yes Historical Provider, MD  pantoprazole (PROTONIX) 40 MG tablet Take 40 mg by mouth every evening.    Yes Historical Provider, MD  potassium chloride SA (KLOR-CON M20) 20 MEQ tablet Take 20 mEq by mouth daily with lunch.    Yes Historical Provider, MD  Tamsulosin HCl (FLOMAX) 0.4 MG CAPS Take 0.4 mg by mouth at bedtime.    Yes Historical Provider, MD  traMADol (ULTRAM) 50 MG tablet Take 50 mg by mouth every 6 (six) hours as needed for moderate pain.    Yes Historical Provider, MD  vitamin B-12 (CYANOCOBALAMIN) 1000 MCG tablet Take 1,000 mcg by mouth daily with lunch.    Yes Historical Provider, MD    Family History Family History  Problem Relation Age of Onset  . Coronary artery disease      family history   . Arthritis    . Cancer      Social History Social History  Substance Use Topics  . Smoking status: Former Smoker    Types: Cigarettes, Cigars, Pipe    Start date: 08/22/1940    Quit date: 08/22/1974  . Smokeless tobacco: Never Used  . Alcohol use No     Allergies   Morphine   Review of Systems Review of Systems  Reason unable to perform ROS: mild dementia.     Physical Exam Updated Vital Signs BP 130/76 (BP Location: Left Arm)   Pulse 67   Temp 98.1 F (36.7 C) (Oral)   Resp 12   Ht 6' (1.829 m)   Wt 220 lb (99.8 kg)   SpO2 99%   BMI 29.84 kg/m    Physical Exam  Constitutional:  Family reports normal appearance and behavior  HENT:  Head: Normocephalic and atraumatic.  Eyes: Conjunctivae are normal.  Neck: Neck supple.  Cardiovascular: Normal rate and regular rhythm.   Pulmonary/Chest: Effort normal and breath sounds normal.  Abdominal: Soft. Bowel sounds are normal.  Musculoskeletal: Normal range of motion.  Neurological: He is alert.  Skin: Skin is warm and dry.  Psychiatric:  Flat affect  Nursing note and vitals reviewed.    ED Treatments / Results  Labs (all labs ordered are listed, but only abnormal results are displayed) Labs Reviewed  BASIC METABOLIC PANEL - Abnormal; Notable for the following:       Result Value   Glucose, Bld 100 (*)    BUN 32 (*)    Creatinine, Ser 2.68 (*)    GFR calc non Af Amer 20 (*)    GFR calc Af Amer 23 (*)    All other components within normal limits  CBC - Abnormal; Notable for the following:    RBC 3.62 (*)    Hemoglobin 11.2 (*)    HCT 34.9 (*)    All other components within normal limits  URINALYSIS, ROUTINE W REFLEX MICROSCOPIC (NOT AT [facility])  I-STAT TROPOININ, ED    EKG  EKG Interpretation  Date/Time:  Wednesday April 27 2016 15:23:19 EDT Ventricular Rate:  59 PR Interval:    QRS Duration: 96 QT Interval:  402 QTC Calculation: 399 R Axis:   47 Text Interpretation:  Unknown rhythm, irregular rate Borderline prolonged PR interval Abnormal R-wave progression, early transition Confirmed by Lacinda Axon  MD, Senie Lanese ([id number]) on 04/27/2016 4:03:12 PM       Radiology Dg Chest 2 View  Result Date: 04/27/2016 CLINICAL DATA:  Generalized chest pain for 2 days. EXAM: CHEST  2 VIEW COMPARISON:  02/23/2014 FINDINGS: The heart is within normal limits in size and stable. Stable surgical changes from bypass surgery. There is moderate tortuosity and calcification of the thoracic aorta. The lungs are clear of acute process. No pleural effusions or pulmonary edema. The bony thorax is  intact. IMPRESSION: No acute cardiopulmonary findings. Electronically Signed   By: Marijo Sanes M.D.   On: 04/27/2016 16:30    Procedures Procedures (including critical care time)  Medications Ordered in ED Medications  sodium chloride 0.9 % bolus 500 mL (0 mLs Intravenous Stopped 04/27/16 1843)     Initial Impression / Assessment and Plan / ED Course  I have reviewed the triage vital signs and the nursing notes.  Pertinent labs & imaging results that were available during my care of the patient were reviewed by me and considered in my medical decision making (see chart for details).  Clinical Course    Screening tests including EKG, chest x-ray, troponin, urinalysis all without major changes. His creatinines been elevated in the past. Family reports baseline behavior. Will discharge.  Final Clinical Impressions(s) / ED Diagnoses   Final diagnoses:  Weakness    New Prescriptions New Prescriptions   No medications on file     Nat Christen, MD 04/28/16 1510

## 2016-04-28 ENCOUNTER — Encounter (HOSPITAL_COMMUNITY): Payer: Self-pay | Admitting: Emergency Medicine

## 2016-05-03 ENCOUNTER — Emergency Department (HOSPITAL_COMMUNITY): Payer: PPO

## 2016-05-03 ENCOUNTER — Encounter (HOSPITAL_COMMUNITY): Payer: Self-pay | Admitting: Emergency Medicine

## 2016-05-03 ENCOUNTER — Emergency Department (HOSPITAL_COMMUNITY)
Admission: EM | Admit: 2016-05-03 | Discharge: 2016-05-04 | Disposition: A | Discharge status: Home / Self Care | Payer: PPO | Attending: Emergency Medicine | Admitting: Emergency Medicine

## 2016-05-03 DIAGNOSIS — Z7982 Long term (current) use of aspirin: Secondary | ICD-10-CM | POA: Diagnosis not present

## 2016-05-03 DIAGNOSIS — R0781 Pleurodynia: Secondary | ICD-10-CM | POA: Diagnosis not present

## 2016-05-03 DIAGNOSIS — Z87891 Personal history of nicotine dependence: Secondary | ICD-10-CM | POA: Insufficient documentation

## 2016-05-03 DIAGNOSIS — W19XXXA Unspecified fall, initial encounter: Secondary | ICD-10-CM | POA: Diagnosis not present

## 2016-05-03 DIAGNOSIS — Y929 Unspecified place or not applicable: Secondary | ICD-10-CM | POA: Insufficient documentation

## 2016-05-03 DIAGNOSIS — Z85818 Personal history of malignant neoplasm of other sites of lip, oral cavity, and pharynx: Secondary | ICD-10-CM | POA: Diagnosis not present

## 2016-05-03 DIAGNOSIS — Y999 Unspecified external cause status: Secondary | ICD-10-CM | POA: Insufficient documentation

## 2016-05-03 DIAGNOSIS — R0789 Other chest pain: Secondary | ICD-10-CM | POA: Diagnosis not present

## 2016-05-03 DIAGNOSIS — I10 Essential (primary) hypertension: Secondary | ICD-10-CM | POA: Diagnosis not present

## 2016-05-03 DIAGNOSIS — Y939 Activity, unspecified: Secondary | ICD-10-CM | POA: Diagnosis not present

## 2016-05-03 DIAGNOSIS — Z79899 Other long term (current) drug therapy: Secondary | ICD-10-CM | POA: Diagnosis not present

## 2016-05-03 DIAGNOSIS — Z85038 Personal history of other malignant neoplasm of large intestine: Secondary | ICD-10-CM | POA: Diagnosis not present

## 2016-05-03 DIAGNOSIS — R079 Chest pain, unspecified: Secondary | ICD-10-CM | POA: Diagnosis not present

## 2016-05-03 DIAGNOSIS — M25551 Pain in right hip: Secondary | ICD-10-CM | POA: Diagnosis not present

## 2016-05-03 DIAGNOSIS — I2510 Atherosclerotic heart disease of native coronary artery without angina pectoris: Secondary | ICD-10-CM | POA: Diagnosis not present

## 2016-05-03 DIAGNOSIS — R0602 Shortness of breath: Secondary | ICD-10-CM | POA: Diagnosis not present

## 2016-05-03 LAB — BASIC METABOLIC PANEL
ANION GAP: 7 (ref 5–15)
BUN: 30 mg/dL — AB (ref 6–20)
CALCIUM: 10.2 mg/dL (ref 8.9–10.3)
CO2: 30 mmol/L (ref 22–32)
Chloride: 105 mmol/L (ref 101–111)
Creatinine, Ser: 2.32 mg/dL — ABNORMAL HIGH (ref 0.61–1.24)
GFR calc Af Amer: 27 mL/min — ABNORMAL LOW (ref >60)
GFR, EST NON AFRICAN AMERICAN: 24 mL/min — AB (ref >60)
GLUCOSE: 125 mg/dL — AB (ref 65–99)
Potassium: 4.2 mmol/L (ref 3.5–5.1)
SODIUM: 142 mmol/L (ref 135–145)

## 2016-05-03 LAB — URINALYSIS, ROUTINE W REFLEX MICROSCOPIC
BILIRUBIN URINE: NEGATIVE
Glucose, UA: NEGATIVE mg/dL
HGB URINE DIPSTICK: NEGATIVE
KETONES UR: NEGATIVE mg/dL
Leukocytes, UA: NEGATIVE
NITRITE: NEGATIVE
PROTEIN: NEGATIVE mg/dL
Specific Gravity, Urine: 1.015 (ref 1.005–1.030)
pH: 5.5 (ref 5.0–8.0)

## 2016-05-03 LAB — CBC
HCT: 34 % — ABNORMAL LOW (ref 39.0–52.0)
Hemoglobin: 11 g/dL — ABNORMAL LOW (ref 13.0–17.0)
MCH: 31.3 pg (ref 26.0–34.0)
MCHC: 32.4 g/dL (ref 30.0–36.0)
MCV: 96.6 fL (ref 78.0–100.0)
PLATELETS: 175 10*3/uL (ref 150–400)
RBC: 3.52 MIL/uL — ABNORMAL LOW (ref 4.22–5.81)
RDW: 14.2 % (ref 11.5–15.5)
WBC: 5.2 10*3/uL (ref 4.0–10.5)

## 2016-05-03 LAB — TROPONIN I

## 2016-05-03 MED ORDER — IBUPROFEN 400 MG PO TABS: 400.0000 mg | ORAL_TABLET | Freq: Four times a day (QID) | ORAL | 0 refills | Status: DC | PRN

## 2016-05-03 MED ORDER — KETOROLAC TROMETHAMINE 60 MG/2ML IM SOLN
20.0000 mg | Freq: Once | INTRAMUSCULAR | Status: AC
  Administered 2016-05-03: 22:00:00 via INTRAMUSCULAR
  Filled 2016-05-03: qty 2

## 2016-05-03 NOTE — ED Provider Notes (Signed)
[facility] DEPT Provider Note   CSN: [id number] Arrival date & time: 05/03/16  2122     History   Chief Complaint Chief Complaint  Patient presents with  . Fall  . Rib Injury    HPI Devin Warner is a 82 y.o. male.  Fell on Sunday and has had progressively worsening right-sided rib pain since that time. No shortness of breath, nausea, vomiting or diaphoresis. No associated symptoms. The pain is worse with touching it, movement and deep breaths. Has had multiple falls recently. Is increasingly weak.      Past Medical History:  Diagnosis Date  . Arthritis   . Cancer (HCC)    Mouth and colon  . Coronary atherosclerosis of native coronary artery   . Hypertension   . Kidney stones   . Spinal stenosis, lumbar region, without neurogenic claudication   . Syncope and collapse     Patient Active Problem List   Diagnosis Date Noted  . Dysphagia, unspecified(787.20) 03/25/2014  . Rotator cuff tear, right 02/03/2014  . Bursitis, shoulder 08/01/2013  . Arthritis of knee, left 07/04/2012  . OA (osteoarthritis) of knee 05/30/2012  . Effusion of knee joint 05/30/2012  . Knee pain, right 05/30/2012  . WOUND, FINGER 11/08/2010  . CAD, NATIVE VESSEL 01/13/2010  . SPINAL STENOSIS, LUMBAR 01/29/2009    Past Surgical History:  Procedure Laterality Date  . BACK SURGERY    . BALLOON DILATION N/A 04/30/2014   Procedure: BALLOON DILATION;  Surgeon: Rogene Houston, MD;  [facility];  Service: Endoscopy;  Laterality: N/A;  . CATARACT EXTRACTION    . CORONARY ARTERY BYPASS GRAFT    . ESOPHAGOGASTRODUODENOSCOPY  02/17/2006   with esophageal dilation followed by colonscopy   . ESOPHAGOGASTRODUODENOSCOPY N/A 04/30/2014   Procedure: ESOPHAGOGASTRODUODENOSCOPY (EGD);  Surgeon: Rogene Houston, MD;  [facility];  Service: Endoscopy;  Laterality: N/A;  255-moved to 100 Ann to notify pt  . HEMICOLECTOMY  04/03/2002  . MALONEY DILATION N/A 04/30/2014   Procedure: Venia Minks  DILATION;  Surgeon: Rogene Houston, MD;  [facility];  Service: Endoscopy;  Laterality: N/A;  . 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  10/09/01   Surgeon Laurita Quint   . SAVORY DILATION N/A 04/30/2014   Procedure: SAVORY DILATION;  Surgeon: Rogene Houston, MD;  [facility];  Service: Endoscopy;  Laterality: N/A;       Home Medications    Prior to Admission medications   Medication Sig Start Date End Date Taking? Authorizing Provider  acetaminophen (TYLENOL) 500 MG tablet Take 500-1,000 mg by mouth every 6 (six) hours as needed for mild pain, fever or headache.    Yes Historical Provider, MD  ALPRAZolam Duanne Moron) 0.5 MG tablet Take 0.5 mg by mouth 4 (four) times daily as needed for anxiety.    Yes Historical Provider, MD  aspirin EC 81 MG tablet Take 81 mg by mouth every morning.    Yes Historical Provider, MD  fenofibrate micronized (LOFIBRA) 134 MG capsule Take 134 mg by mouth every evening.    Yes Historical Provider, MD  furosemide (LASIX) 20 MG tablet Take 20 mg by mouth every evening.   Yes Historical Provider, MD  furosemide (LASIX) 40 MG tablet Take 40 mg by mouth every morning.   Yes Historical Provider, MD  hydrocortisone (ANUSOL-HC) 2.5 % rectal cream Place 1 application rectally 2 (two) times daily as needed for hemorrhoids or itching.   Yes Historical Provider, MD  lidocaine (LIDODERM) 5 % Place 1 patch onto the skin daily as needed (for pain). Pt applies to back.   Remove & Discard patch within 12 hours or as directed by MD    Yes Historical Provider, MD  metoprolol tartrate (LOPRESSOR) 25 MG tablet Take 25 mg by mouth every evening.    Yes Historical Provider, MD  Multiple Vitamin (MULTIVITAMIN WITH MINERALS) TABS tablet Take 1 tablet by mouth daily with lunch.   Yes Historical Provider, MD  mupirocin ointment (BACTROBAN) 2 % Apply 1 application topically 2 (two) times daily as needed (for sores).   Yes Historical Provider, MD  NAMENDA XR  28 MG CP24 Take 28 mg by mouth at bedtime.    Yes Historical Provider, MD  nitroGLYCERIN (NITROSTAT) 0.4 MG SL tablet Place 0.4 mg under the tongue every 5 (five) minutes as needed for chest pain.   Yes Historical Provider, MD  omega-3 acid ethyl esters (LOVAZA) 1 G capsule Take 1 g by mouth 2 (two) times daily.    Yes Historical Provider, MD  oxyCODONE-acetaminophen (PERCOCET) 10-325 MG per tablet Take 1 tablet by mouth every 6 (six) hours as needed for pain.    Yes Historical Provider, MD  pantoprazole (PROTONIX) 40 MG tablet Take 40 mg by mouth every evening.    Yes Historical Provider, MD  potassium chloride SA (KLOR-CON M20) 20 MEQ tablet Take 20 mEq by mouth daily with lunch.    Yes Historical Provider, MD  Tamsulosin HCl (FLOMAX) 0.4 MG CAPS Take 0.4 mg by mouth at bedtime.    Yes Historical Provider, MD  traMADol (ULTRAM) 50 MG tablet Take 50 mg by mouth every 6 (six) hours as needed for moderate pain.    Yes Historical Provider, MD  vitamin B-12 (CYANOCOBALAMIN) 1000 MCG tablet Take 1,000 mcg by mouth daily with lunch.    Yes Historical Provider, MD  ibuprofen (ADVIL,MOTRIN) 400 MG tablet Take 1 tablet (400 mg total) by mouth every 6 (six) hours as needed. 05/03/16   Merrily Pew, MD    Family History Family History  Problem Relation Age of Onset  . Coronary artery disease      family history   . Arthritis    . Cancer      Social History Social History  Substance Use Topics  . Smoking status: Former Smoker    Types: Cigarettes, Cigars, Pipe    Start date: 08/22/1940    Quit date: 08/22/1974  . Smokeless tobacco: Never Used  . Alcohol use No     Allergies   Morphine   Review of Systems Review of Systems  All other systems reviewed and are negative.    Physical Exam Updated Vital Signs BP 138/69   Pulse 86   Temp 98 F (36.7 C) (Oral)   Resp 18   Ht 6' (1.829 m)   Wt 220 lb (99.8 kg)   SpO2 100%   BMI 29.84 kg/m   Physical Exam  Constitutional: He appears  well-developed and well-nourished.  HENT:  Head: Normocephalic and atraumatic.  Eyes: Conjunctivae are normal.  Neck: Neck supple.  Cardiovascular: Normal rate and regular rhythm.   No murmur heard. Pulmonary/Chest: Effort normal and breath sounds normal. No respiratory distress. He exhibits tenderness (right lateral).  Abdominal: Soft. There is no tenderness.  Musculoskeletal: He exhibits no edema.  Neurological: He is alert.  Skin: Skin is warm and dry.  Psychiatric: He has a normal mood and affect.  Nursing note and vitals reviewed.  ED Treatments / Results  Labs (all labs ordered are listed, but only abnormal results are displayed) Labs Reviewed  BASIC METABOLIC PANEL - Abnormal; Notable for the following:       Result Value   Glucose, Bld 125 (*)    BUN 30 (*)    Creatinine, Ser 2.32 (*)    GFR calc non Af Amer 24 (*)    GFR calc Af Amer 27 (*)    All other components within normal limits  CBC - Abnormal; Notable for the following:    RBC 3.52 (*)    Hemoglobin 11.0 (*)    HCT 34.0 (*)    All other components within normal limits  TROPONIN I  URINALYSIS, ROUTINE W REFLEX MICROSCOPIC (NOT AT [facility])    EKG  EKG Interpretation None       Radiology Dg Chest 2 View  Result Date: 05/03/2016 CLINICAL DATA:  Chest pain and shortness of breath EXAM: CHEST  2 VIEW COMPARISON:  Chest radiograph 04/27/2016 FINDINGS: The lungs are well inflated. There are diffusely prominent interstitial markings, unchanged. Cardiomediastinal contours are unchanged. There is no focal airspace consolidation or pulmonary edema. There is bibasilar atelectasis, right greater than left. No pneumothorax or sizable pleural effusion. Asymmetric elevation of the right hemidiaphragm is unchanged. Median sternotomy wires, CABG markers and cholecystectomy clips are unchanged. IMPRESSION: Bibasilar atelectasis. Electronically Signed   By: Ulyses Jarred M.D.   On: 05/03/2016 22:26     Procedures Procedures (including critical care time)  Medications Ordered in ED Medications  ketorolac (TORADOL) injection 20 mg ( Intramuscular Given 05/03/16 2224)     Initial Impression / Assessment and Plan / ED Course  I have reviewed the triage vital signs and the nursing notes.  Pertinent labs & imaging results that were available during my care of the patient were reviewed by me and considered in my medical decision making (see chart for details).  Clinical Course    Fall with right sided rib pain but no Obvious fracture on chest x-ray. Even with negative chest x-ray I suspect occult rib fracture and was treated the same. Secondary to his multiple falls I approached the idea of possible placement however he was very resistant to that. Because of this I'll ask that physical therapy and occupational therapy to evaluate the patient in the home to see present in the due to reduce the chance of him falling and hurting himself further. Family is agreeable with this plan and is discharged in stable condition.  Final Clinical Impressions(s) / ED Diagnoses   Final diagnoses:  Fall, initial encounter  Rib pain on right side    New Prescriptions Discharge Medication List as of 05/03/2016 11:26 PM    START taking these medications   Details  ibuprofen (ADVIL,MOTRIN) 400 MG tablet Take 1 tablet (400 mg total) by mouth every 6 (six) hours as needed., Starting Tue 05/03/2016, Print         Merrily Pew, MD 05/04/16 [id number]

## 2016-05-03 NOTE — ED Triage Notes (Signed)
Pt c/o fall over the weekend and right rib pain. Pt took aspirin and 2 nitro with no relief prior to ems arrival.

## 2016-05-04 NOTE — Care Management Note (Signed)
Case Management Note  Patient Details  Name: KARIN PISANO MRN: [id number] Date of Birth: 1926-12-20   Contacted by ER for [facility]. Patient discharged home last night from ER. Home health and face to face ordered last night be EDP. Romualdo Bolk of [facility] notified and has obtained orders from chart.   Azzure Garabedian, [name], RN 05/04/2016, 9:27 AM

## 2016-05-05 DIAGNOSIS — Z87891 Personal history of nicotine dependence: Secondary | ICD-10-CM | POA: Diagnosis not present

## 2016-05-05 DIAGNOSIS — R0781 Pleurodynia: Secondary | ICD-10-CM | POA: Diagnosis not present

## 2016-05-05 DIAGNOSIS — Z85038 Personal history of other malignant neoplasm of large intestine: Secondary | ICD-10-CM | POA: Diagnosis not present

## 2016-05-05 DIAGNOSIS — Z85818 Personal history of malignant neoplasm of other sites of lip, oral cavity, and pharynx: Secondary | ICD-10-CM | POA: Diagnosis not present

## 2016-05-05 DIAGNOSIS — M810 Age-related osteoporosis without current pathological fracture: Secondary | ICD-10-CM | POA: Diagnosis not present

## 2016-05-05 DIAGNOSIS — Z7982 Long term (current) use of aspirin: Secondary | ICD-10-CM | POA: Diagnosis not present

## 2016-05-05 DIAGNOSIS — W19XXXD Unspecified fall, subsequent encounter: Secondary | ICD-10-CM | POA: Diagnosis not present

## 2016-05-05 DIAGNOSIS — I10 Essential (primary) hypertension: Secondary | ICD-10-CM | POA: Diagnosis not present

## 2016-05-05 DIAGNOSIS — M4806 Spinal stenosis, lumbar region: Secondary | ICD-10-CM | POA: Diagnosis not present

## 2016-05-05 DIAGNOSIS — L89322 Pressure ulcer of left buttock, stage 2: Secondary | ICD-10-CM | POA: Diagnosis not present

## 2016-05-05 DIAGNOSIS — I2510 Atherosclerotic heart disease of native coronary artery without angina pectoris: Secondary | ICD-10-CM | POA: Diagnosis not present

## 2016-05-06 DIAGNOSIS — I10 Essential (primary) hypertension: Secondary | ICD-10-CM | POA: Diagnosis not present

## 2016-05-06 DIAGNOSIS — Z7982 Long term (current) use of aspirin: Secondary | ICD-10-CM | POA: Diagnosis not present

## 2016-05-06 DIAGNOSIS — I2510 Atherosclerotic heart disease of native coronary artery without angina pectoris: Secondary | ICD-10-CM | POA: Diagnosis not present

## 2016-05-06 DIAGNOSIS — Z87891 Personal history of nicotine dependence: Secondary | ICD-10-CM | POA: Diagnosis not present

## 2016-05-06 DIAGNOSIS — R0781 Pleurodynia: Secondary | ICD-10-CM | POA: Diagnosis not present

## 2016-05-09 DIAGNOSIS — I2510 Atherosclerotic heart disease of native coronary artery without angina pectoris: Secondary | ICD-10-CM | POA: Diagnosis not present

## 2016-05-09 DIAGNOSIS — M4806 Spinal stenosis, lumbar region: Secondary | ICD-10-CM | POA: Diagnosis not present

## 2016-05-09 DIAGNOSIS — I10 Essential (primary) hypertension: Secondary | ICD-10-CM | POA: Diagnosis not present

## 2016-05-09 DIAGNOSIS — L89322 Pressure ulcer of left buttock, stage 2: Secondary | ICD-10-CM | POA: Diagnosis not present

## 2016-05-09 DIAGNOSIS — Z85818 Personal history of malignant neoplasm of other sites of lip, oral cavity, and pharynx: Secondary | ICD-10-CM | POA: Diagnosis not present

## 2016-05-09 DIAGNOSIS — M810 Age-related osteoporosis without current pathological fracture: Secondary | ICD-10-CM | POA: Diagnosis not present

## 2016-05-09 DIAGNOSIS — Z7982 Long term (current) use of aspirin: Secondary | ICD-10-CM | POA: Diagnosis not present

## 2016-05-09 DIAGNOSIS — R0781 Pleurodynia: Secondary | ICD-10-CM | POA: Diagnosis not present

## 2016-05-09 DIAGNOSIS — W19XXXD Unspecified fall, subsequent encounter: Secondary | ICD-10-CM | POA: Diagnosis not present

## 2016-05-09 DIAGNOSIS — Z85038 Personal history of other malignant neoplasm of large intestine: Secondary | ICD-10-CM | POA: Diagnosis not present

## 2016-05-09 DIAGNOSIS — Z87891 Personal history of nicotine dependence: Secondary | ICD-10-CM | POA: Diagnosis not present

## 2016-05-10 DIAGNOSIS — Z85038 Personal history of other malignant neoplasm of large intestine: Secondary | ICD-10-CM | POA: Diagnosis not present

## 2016-05-10 DIAGNOSIS — I2510 Atherosclerotic heart disease of native coronary artery without angina pectoris: Secondary | ICD-10-CM | POA: Diagnosis not present

## 2016-05-10 DIAGNOSIS — Z7982 Long term (current) use of aspirin: Secondary | ICD-10-CM | POA: Diagnosis not present

## 2016-05-10 DIAGNOSIS — R0781 Pleurodynia: Secondary | ICD-10-CM | POA: Diagnosis not present

## 2016-05-10 DIAGNOSIS — Z87891 Personal history of nicotine dependence: Secondary | ICD-10-CM | POA: Diagnosis not present

## 2016-05-10 DIAGNOSIS — W19XXXD Unspecified fall, subsequent encounter: Secondary | ICD-10-CM | POA: Diagnosis not present

## 2016-05-10 DIAGNOSIS — I10 Essential (primary) hypertension: Secondary | ICD-10-CM | POA: Diagnosis not present

## 2016-05-10 DIAGNOSIS — L89322 Pressure ulcer of left buttock, stage 2: Secondary | ICD-10-CM | POA: Diagnosis not present

## 2016-05-10 DIAGNOSIS — Z85818 Personal history of malignant neoplasm of other sites of lip, oral cavity, and pharynx: Secondary | ICD-10-CM | POA: Diagnosis not present

## 2016-05-10 DIAGNOSIS — M810 Age-related osteoporosis without current pathological fracture: Secondary | ICD-10-CM | POA: Diagnosis not present

## 2016-05-10 DIAGNOSIS — M4806 Spinal stenosis, lumbar region: Secondary | ICD-10-CM | POA: Diagnosis not present

## 2016-05-19 DIAGNOSIS — Z7982 Long term (current) use of aspirin: Secondary | ICD-10-CM | POA: Diagnosis not present

## 2016-05-19 DIAGNOSIS — Z87891 Personal history of nicotine dependence: Secondary | ICD-10-CM | POA: Diagnosis not present

## 2016-05-19 DIAGNOSIS — I2510 Atherosclerotic heart disease of native coronary artery without angina pectoris: Secondary | ICD-10-CM | POA: Diagnosis not present

## 2016-05-19 DIAGNOSIS — I10 Essential (primary) hypertension: Secondary | ICD-10-CM | POA: Diagnosis not present

## 2016-05-19 DIAGNOSIS — M4806 Spinal stenosis, lumbar region: Secondary | ICD-10-CM | POA: Diagnosis not present

## 2016-05-19 DIAGNOSIS — Z85818 Personal history of malignant neoplasm of other sites of lip, oral cavity, and pharynx: Secondary | ICD-10-CM | POA: Diagnosis not present

## 2016-05-19 DIAGNOSIS — M810 Age-related osteoporosis without current pathological fracture: Secondary | ICD-10-CM | POA: Diagnosis not present

## 2016-05-19 DIAGNOSIS — W19XXXD Unspecified fall, subsequent encounter: Secondary | ICD-10-CM | POA: Diagnosis not present

## 2016-05-19 DIAGNOSIS — R0781 Pleurodynia: Secondary | ICD-10-CM | POA: Diagnosis not present

## 2016-05-19 DIAGNOSIS — L89322 Pressure ulcer of left buttock, stage 2: Secondary | ICD-10-CM | POA: Diagnosis not present

## 2016-05-19 DIAGNOSIS — Z85038 Personal history of other malignant neoplasm of large intestine: Secondary | ICD-10-CM | POA: Diagnosis not present

## 2016-08-02 DIAGNOSIS — F0390 Unspecified dementia without behavioral disturbance: Secondary | ICD-10-CM | POA: Diagnosis not present

## 2016-08-02 DIAGNOSIS — M545 Low back pain: Secondary | ICD-10-CM | POA: Diagnosis not present

## 2016-08-02 DIAGNOSIS — I2510 Atherosclerotic heart disease of native coronary artery without angina pectoris: Secondary | ICD-10-CM | POA: Diagnosis not present

## 2016-08-02 DIAGNOSIS — I10 Essential (primary) hypertension: Secondary | ICD-10-CM | POA: Diagnosis not present

## 2016-08-02 DIAGNOSIS — M25511 Pain in right shoulder: Secondary | ICD-10-CM | POA: Diagnosis not present

## 2016-08-10 ENCOUNTER — Encounter: Payer: Self-pay | Admitting: Cardiovascular Disease

## 2016-08-10 ENCOUNTER — Ambulatory Visit (INDEPENDENT_AMBULATORY_CARE_PROVIDER_SITE_OTHER): Payer: PPO | Admitting: Cardiovascular Disease

## 2016-08-10 VITALS — BP 122/72 | HR 88 | Ht 70.0 in | Wt 200.0 lb

## 2016-08-10 DIAGNOSIS — I25810 Atherosclerosis of coronary artery bypass graft(s) without angina pectoris: Secondary | ICD-10-CM | POA: Diagnosis not present

## 2016-08-10 DIAGNOSIS — R600 Localized edema: Secondary | ICD-10-CM

## 2016-08-10 NOTE — Patient Instructions (Signed)

## 2016-08-10 NOTE — Progress Notes (Signed)
SUBJECTIVE: The patient presents for follow-up of coronary artery disease with history of CABG.   Denies chest pain, shortness of breath, and leg swelling. Biggest complaints are right shoulder and knee pain. Walks with a walker. No recent nitroglycerin use.   Review of Systems: As per "subjective", otherwise negative.  Allergies  Allergen Reactions  . Morphine Hives and Anxiety    Current Outpatient Prescriptions  Medication Sig Dispense Refill  . acetaminophen (TYLENOL) 500 MG tablet Take 500-1,000 mg by mouth every 6 (six) hours as needed for mild pain, fever or headache.     . ALPRAZolam (XANAX) 0.5 MG tablet Take 0.5 mg by mouth 4 (four) times daily as needed for anxiety.     Marland Kitchen aspirin EC 81 MG tablet Take 81 mg by mouth every morning.     . fenofibrate micronized (LOFIBRA) 134 MG capsule Take 134 mg by mouth every evening.     . furosemide (LASIX) 20 MG tablet Take 20 mg by mouth every evening.    . furosemide (LASIX) 40 MG tablet Take 40 mg by mouth every morning.    . hydrocortisone (ANUSOL-HC) 2.5 % rectal cream Place 1 application rectally 2 (two) times daily as needed for hemorrhoids or itching.    Marland Kitchen ibuprofen (ADVIL,MOTRIN) 400 MG tablet Take 1 tablet (400 mg total) by mouth every 6 (six) hours as needed. 30 tablet 0  . lidocaine (LIDODERM) 5 % Place 1 patch onto the skin daily as needed (for pain). Pt applies to back.   Remove & Discard patch within 12 hours or as directed by MD     . metoprolol tartrate (LOPRESSOR) 25 MG tablet Take 25 mg by mouth every evening.     . Multiple Vitamin (MULTIVITAMIN WITH MINERALS) TABS tablet Take 1 tablet by mouth daily with lunch.    . mupirocin ointment (BACTROBAN) 2 % Apply 1 application topically 2 (two) times daily as needed (for sores).    Marland Kitchen NAMENDA XR 28 MG CP24 Take 28 mg by mouth at bedtime.   12  . nitroGLYCERIN (NITROSTAT) 0.4 MG SL tablet Place 0.4 mg under the tongue every 5 (five) minutes as needed for chest pain.      Marland Kitchen omega-3 acid ethyl esters (LOVAZA) 1 G capsule Take 1 g by mouth 2 (two) times daily.     Marland Kitchen oxyCODONE-acetaminophen (PERCOCET) 10-325 MG per tablet Take 1 tablet by mouth every 6 (six) hours as needed for pain.     . pantoprazole (PROTONIX) 40 MG tablet Take 40 mg by mouth every evening.     . potassium chloride SA (KLOR-CON M20) 20 MEQ tablet Take 20 mEq by mouth daily with lunch.     . Tamsulosin HCl (FLOMAX) 0.4 MG CAPS Take 0.4 mg by mouth at bedtime.     . traMADol (ULTRAM) 50 MG tablet Take 50 mg by mouth every 6 (six) hours as needed for moderate pain.     . vitamin B-12 (CYANOCOBALAMIN) 1000 MCG tablet Take 1,000 mcg by mouth daily with lunch.      No current facility-administered medications for this visit.     Past Medical History:  Diagnosis Date  . Arthritis   . Cancer (HCC)    Mouth and colon  . Coronary atherosclerosis of native coronary artery   . Hypertension   . Kidney stones   . Spinal stenosis, lumbar region, without neurogenic claudication   . Syncope and collapse     Past Surgical History:  Procedure Laterality Date  . BACK SURGERY    . BALLOON DILATION N/A 04/30/2014   Procedure: BALLOON DILATION;  Surgeon: Rogene Houston, MD;  Location: [facility];  Service: Endoscopy;  Laterality: N/A;  . CATARACT EXTRACTION    . CORONARY ARTERY BYPASS GRAFT    . ESOPHAGOGASTRODUODENOSCOPY  02/17/2006   with esophageal dilation followed by colonscopy   . ESOPHAGOGASTRODUODENOSCOPY N/A 04/30/2014   Procedure: ESOPHAGOGASTRODUODENOSCOPY (EGD);  Surgeon: Rogene Houston, MD;  Location: [facility];  Service: Endoscopy;  Laterality: N/A;  255-moved to 100 Ann to notify pt  . HEMICOLECTOMY  04/03/2002  . MALONEY DILATION N/A 04/30/2014   Procedure: Venia Minks DILATION;  Surgeon: Rogene Houston, MD;  Location: [facility];  Service: Endoscopy;  Laterality: N/A;  . 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  10/09/01   Surgeon Laurita Quint   . SAVORY DILATION N/A  04/30/2014   Procedure: SAVORY DILATION;  Surgeon: Rogene Houston, MD;  Location: [facility];  Service: Endoscopy;  Laterality: N/A;    Social History   Social History  . Marital status: Married    Spouse name: N/A  . Number of children: N/A  . Years of education: N/A   Occupational History  . Not on file.   Social History Main Topics  . Smoking status: Former Smoker    Types: Cigarettes, Cigars, Pipe    Start date: 08/22/1940    Quit date: 08/22/1974  . Smokeless tobacco: Never Used  . Alcohol use No  . Drug use: No  . Sexual activity: Not on file   Other Topics Concern  . Not on file   Social History Narrative  . No narrative on file     Vitals:   08/10/16 1316  BP: 122/72  Pulse: 88  SpO2: 95%  Weight: 200 lb (90.7 kg)  Height: 5\' 10"  (1.778 m)    PHYSICAL EXAM General: NAD HEENT: Normal. Neck: No JVD, no thyromegaly. Lungs: Clear to auscultation bilaterally with normal respiratory effort. CV: Nondisplaced PMI.  Regular rate and rhythm, normal S1/S2, no S3/S4, no murmur. No pretibial or periankle edema.   Abdomen: Soft, nontender. Neurologic: Alert and oriented.  Psych: Normal affect. Musculoskeletal: Kyphotic.    ECG: Most recent ECG reviewed.      ASSESSMENT AND PLAN: 1. CAD and CABG in 2003: Symptomatically stable. Lexiscan Cardiolite showed no evidence of ischemia on 12/31/14. Echo showed normal left ventricular systolic function and grade I diastolic dysfunction.. Continue aspirin and metoprolol.  2. Leg swelling: Echo showed normal left ventricular systolic function and grade I diastolic dysfunction on 0000000. Continue Lasix 40 mg every morning and 20 mg every evening. Continue supplemental potassium chloride 20 once daily.  Dispo: f/u 1 year.    Kate Sable, M.D., F.A.C.C.

## 2016-08-26 DIAGNOSIS — M545 Low back pain: Secondary | ICD-10-CM | POA: Diagnosis not present

## 2016-11-30 DIAGNOSIS — M545 Low back pain: Secondary | ICD-10-CM | POA: Diagnosis not present

## 2016-11-30 DIAGNOSIS — I2510 Atherosclerotic heart disease of native coronary artery without angina pectoris: Secondary | ICD-10-CM | POA: Diagnosis not present

## 2016-11-30 DIAGNOSIS — F0390 Unspecified dementia without behavioral disturbance: Secondary | ICD-10-CM | POA: Diagnosis not present

## 2016-11-30 DIAGNOSIS — I10 Essential (primary) hypertension: Secondary | ICD-10-CM | POA: Diagnosis not present

## 2016-12-01 ENCOUNTER — Other Ambulatory Visit: Payer: Self-pay | Admitting: Cardiovascular Disease

## 2016-12-07 ENCOUNTER — Emergency Department (HOSPITAL_COMMUNITY)
Admission: EM | Admit: 2016-12-07 | Discharge: 2016-12-07 | Disposition: A | Discharge status: Home / Self Care | Payer: PPO | Attending: Emergency Medicine | Admitting: Emergency Medicine

## 2016-12-07 ENCOUNTER — Encounter (HOSPITAL_COMMUNITY): Payer: Self-pay | Admitting: *Deleted

## 2016-12-07 ENCOUNTER — Emergency Department (HOSPITAL_COMMUNITY): Payer: PPO

## 2016-12-07 DIAGNOSIS — Y939 Activity, unspecified: Secondary | ICD-10-CM | POA: Insufficient documentation

## 2016-12-07 DIAGNOSIS — I10 Essential (primary) hypertension: Secondary | ICD-10-CM | POA: Insufficient documentation

## 2016-12-07 DIAGNOSIS — S0011XA Contusion of right eyelid and periocular area, initial encounter: Secondary | ICD-10-CM | POA: Insufficient documentation

## 2016-12-07 DIAGNOSIS — Z87891 Personal history of nicotine dependence: Secondary | ICD-10-CM | POA: Insufficient documentation

## 2016-12-07 DIAGNOSIS — S0003XA Contusion of scalp, initial encounter: Secondary | ICD-10-CM | POA: Diagnosis not present

## 2016-12-07 DIAGNOSIS — M25511 Pain in right shoulder: Secondary | ICD-10-CM | POA: Insufficient documentation

## 2016-12-07 DIAGNOSIS — Z7982 Long term (current) use of aspirin: Secondary | ICD-10-CM | POA: Insufficient documentation

## 2016-12-07 DIAGNOSIS — Y929 Unspecified place or not applicable: Secondary | ICD-10-CM | POA: Diagnosis not present

## 2016-12-07 DIAGNOSIS — Y999 Unspecified external cause status: Secondary | ICD-10-CM | POA: Insufficient documentation

## 2016-12-07 DIAGNOSIS — I2510 Atherosclerotic heart disease of native coronary artery without angina pectoris: Secondary | ICD-10-CM | POA: Diagnosis not present

## 2016-12-07 DIAGNOSIS — M549 Dorsalgia, unspecified: Secondary | ICD-10-CM | POA: Diagnosis not present

## 2016-12-07 DIAGNOSIS — W1800XA Striking against unspecified object with subsequent fall, initial encounter: Secondary | ICD-10-CM | POA: Insufficient documentation

## 2016-12-07 DIAGNOSIS — S0990XA Unspecified injury of head, initial encounter: Secondary | ICD-10-CM | POA: Diagnosis not present

## 2016-12-07 DIAGNOSIS — W19XXXA Unspecified fall, initial encounter: Secondary | ICD-10-CM

## 2016-12-07 DIAGNOSIS — S3992XA Unspecified injury of lower back, initial encounter: Secondary | ICD-10-CM | POA: Diagnosis not present

## 2016-12-07 DIAGNOSIS — Z79899 Other long term (current) drug therapy: Secondary | ICD-10-CM | POA: Diagnosis not present

## 2016-12-07 MED ORDER — ACETAMINOPHEN 500 MG PO TABS: 500.0000 mg | ORAL_TABLET | Freq: Four times a day (QID) | ORAL | 0 refills | Status: AC | PRN
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
## 2016-12-07 NOTE — ED Notes (Signed)
Pt friend no happy with wait time right now. Pt and friend made aware that we have completed CT on patient and if any problems were noted we would have taken patient back to room. Pt offered blanket and other comfort measures. Charge nurse made aware.

## 2016-12-13 DIAGNOSIS — I2510 Atherosclerotic heart disease of native coronary artery without angina pectoris: Secondary | ICD-10-CM | POA: Diagnosis not present

## 2016-12-13 DIAGNOSIS — S0003XD Contusion of scalp, subsequent encounter: Secondary | ICD-10-CM | POA: Diagnosis not present

## 2016-12-13 DIAGNOSIS — W19XXXD Unspecified fall, subsequent encounter: Secondary | ICD-10-CM | POA: Diagnosis not present

## 2016-12-13 DIAGNOSIS — S0083XD Contusion of other part of head, subsequent encounter: Secondary | ICD-10-CM | POA: Diagnosis not present

## 2017-04-03 DIAGNOSIS — F0390 Unspecified dementia without behavioral disturbance: Secondary | ICD-10-CM | POA: Diagnosis not present

## 2017-04-03 DIAGNOSIS — M25511 Pain in right shoulder: Secondary | ICD-10-CM | POA: Diagnosis not present

## 2017-04-03 DIAGNOSIS — M1990 Unspecified osteoarthritis, unspecified site: Secondary | ICD-10-CM | POA: Diagnosis not present

## 2017-04-03 DIAGNOSIS — M545 Low back pain: Secondary | ICD-10-CM | POA: Diagnosis not present

## 2017-04-03 DIAGNOSIS — I2510 Atherosclerotic heart disease of native coronary artery without angina pectoris: Secondary | ICD-10-CM | POA: Diagnosis not present

## 2017-06-24 ENCOUNTER — Emergency Department (HOSPITAL_COMMUNITY): Payer: PPO

## 2017-06-24 ENCOUNTER — Inpatient Hospital Stay (HOSPITAL_COMMUNITY)
Admission: EM | Admit: 2017-06-24 | Discharge: 2017-06-28 | DRG: 683 | Disposition: A | Discharge status: Skilled Nursing Facility | Payer: PPO | Attending: Pulmonary Disease | Admitting: Pulmonary Disease

## 2017-06-24 ENCOUNTER — Observation Stay (HOSPITAL_BASED_OUTPATIENT_CLINIC_OR_DEPARTMENT_OTHER): Payer: PPO

## 2017-06-24 ENCOUNTER — Encounter (HOSPITAL_COMMUNITY): Payer: Self-pay | Admitting: *Deleted

## 2017-06-24 DIAGNOSIS — I509 Heart failure, unspecified: Secondary | ICD-10-CM | POA: Diagnosis not present

## 2017-06-24 DIAGNOSIS — Z85819 Personal history of malignant neoplasm of unspecified site of lip, oral cavity, and pharynx: Secondary | ICD-10-CM

## 2017-06-24 DIAGNOSIS — R4781 Slurred speech: Secondary | ICD-10-CM | POA: Diagnosis not present

## 2017-06-24 DIAGNOSIS — E861 Hypovolemia: Secondary | ICD-10-CM | POA: Diagnosis not present

## 2017-06-24 DIAGNOSIS — R1310 Dysphagia, unspecified: Secondary | ICD-10-CM | POA: Diagnosis not present

## 2017-06-24 DIAGNOSIS — N179 Acute kidney failure, unspecified: Principal | ICD-10-CM | POA: Diagnosis present

## 2017-06-24 DIAGNOSIS — I2510 Atherosclerotic heart disease of native coronary artery without angina pectoris: Secondary | ICD-10-CM | POA: Diagnosis present

## 2017-06-24 DIAGNOSIS — I351 Nonrheumatic aortic (valve) insufficiency: Secondary | ICD-10-CM

## 2017-06-24 DIAGNOSIS — M549 Dorsalgia, unspecified: Secondary | ICD-10-CM | POA: Diagnosis not present

## 2017-06-24 DIAGNOSIS — M48061 Spinal stenosis, lumbar region without neurogenic claudication: Secondary | ICD-10-CM | POA: Diagnosis not present

## 2017-06-24 DIAGNOSIS — I6789 Other cerebrovascular disease: Secondary | ICD-10-CM | POA: Diagnosis not present

## 2017-06-24 DIAGNOSIS — Z85038 Personal history of other malignant neoplasm of large intestine: Secondary | ICD-10-CM

## 2017-06-24 DIAGNOSIS — J9811 Atelectasis: Secondary | ICD-10-CM | POA: Diagnosis not present

## 2017-06-24 DIAGNOSIS — L8990 Pressure ulcer of unspecified site, unspecified stage: Secondary | ICD-10-CM | POA: Insufficient documentation

## 2017-06-24 DIAGNOSIS — R0902 Hypoxemia: Secondary | ICD-10-CM | POA: Diagnosis present

## 2017-06-24 DIAGNOSIS — Z9049 Acquired absence of other specified parts of digestive tract: Secondary | ICD-10-CM | POA: Diagnosis not present

## 2017-06-24 DIAGNOSIS — W19XXXA Unspecified fall, initial encounter: Secondary | ICD-10-CM | POA: Diagnosis present

## 2017-06-24 DIAGNOSIS — M545 Low back pain: Secondary | ICD-10-CM | POA: Diagnosis not present

## 2017-06-24 DIAGNOSIS — M25469 Effusion, unspecified knee: Secondary | ICD-10-CM | POA: Diagnosis not present

## 2017-06-24 DIAGNOSIS — Z87891 Personal history of nicotine dependence: Secondary | ICD-10-CM

## 2017-06-24 DIAGNOSIS — S199XXA Unspecified injury of neck, initial encounter: Secondary | ICD-10-CM | POA: Diagnosis not present

## 2017-06-24 DIAGNOSIS — I130 Hypertensive heart and chronic kidney disease with heart failure and stage 1 through stage 4 chronic kidney disease, or unspecified chronic kidney disease: Secondary | ICD-10-CM | POA: Diagnosis present

## 2017-06-24 DIAGNOSIS — D649 Anemia, unspecified: Secondary | ICD-10-CM | POA: Diagnosis present

## 2017-06-24 DIAGNOSIS — I340 Nonrheumatic mitral (valve) insufficiency: Secondary | ICD-10-CM

## 2017-06-24 DIAGNOSIS — S161XXA Strain of muscle, fascia and tendon at neck level, initial encounter: Secondary | ICD-10-CM | POA: Diagnosis present

## 2017-06-24 DIAGNOSIS — S060X9A Concussion with loss of consciousness of unspecified duration, initial encounter: Secondary | ICD-10-CM | POA: Diagnosis not present

## 2017-06-24 DIAGNOSIS — G8929 Other chronic pain: Secondary | ICD-10-CM | POA: Diagnosis not present

## 2017-06-24 DIAGNOSIS — R402441 Other coma, without documented Glasgow coma scale score, or with partial score reported, in the field [EMT or ambulance]: Secondary | ICD-10-CM | POA: Diagnosis not present

## 2017-06-24 DIAGNOSIS — D631 Anemia in chronic kidney disease: Secondary | ICD-10-CM | POA: Diagnosis not present

## 2017-06-24 DIAGNOSIS — N184 Chronic kidney disease, stage 4 (severe): Secondary | ICD-10-CM | POA: Diagnosis not present

## 2017-06-24 DIAGNOSIS — F419 Anxiety disorder, unspecified: Secondary | ICD-10-CM | POA: Diagnosis present

## 2017-06-24 DIAGNOSIS — M1710 Unilateral primary osteoarthritis, unspecified knee: Secondary | ICD-10-CM | POA: Diagnosis not present

## 2017-06-24 DIAGNOSIS — E860 Dehydration: Secondary | ICD-10-CM | POA: Diagnosis not present

## 2017-06-24 DIAGNOSIS — M7550 Bursitis of unspecified shoulder: Secondary | ICD-10-CM | POA: Diagnosis not present

## 2017-06-24 DIAGNOSIS — M25561 Pain in right knee: Secondary | ICD-10-CM | POA: Diagnosis not present

## 2017-06-24 DIAGNOSIS — M75101 Unspecified rotator cuff tear or rupture of right shoulder, not specified as traumatic: Secondary | ICD-10-CM | POA: Diagnosis not present

## 2017-06-24 DIAGNOSIS — R55 Syncope and collapse: Secondary | ICD-10-CM | POA: Diagnosis not present

## 2017-06-24 DIAGNOSIS — Z7401 Bed confinement status: Secondary | ICD-10-CM | POA: Diagnosis not present

## 2017-06-24 DIAGNOSIS — Z951 Presence of aortocoronary bypass graft: Secondary | ICD-10-CM | POA: Diagnosis not present

## 2017-06-24 DIAGNOSIS — I5032 Chronic diastolic (congestive) heart failure: Secondary | ICD-10-CM | POA: Diagnosis present

## 2017-06-24 DIAGNOSIS — Z9181 History of falling: Secondary | ICD-10-CM | POA: Diagnosis not present

## 2017-06-24 DIAGNOSIS — I10 Essential (primary) hypertension: Secondary | ICD-10-CM | POA: Diagnosis not present

## 2017-06-24 DIAGNOSIS — F05 Delirium due to known physiological condition: Secondary | ICD-10-CM | POA: Diagnosis not present

## 2017-06-24 DIAGNOSIS — M1712 Unilateral primary osteoarthritis, left knee: Secondary | ICD-10-CM | POA: Diagnosis not present

## 2017-06-24 DIAGNOSIS — S0990XA Unspecified injury of head, initial encounter: Secondary | ICD-10-CM | POA: Diagnosis not present

## 2017-06-24 DIAGNOSIS — F0390 Unspecified dementia without behavioral disturbance: Secondary | ICD-10-CM | POA: Diagnosis not present

## 2017-06-24 DIAGNOSIS — S298XXA Other specified injuries of thorax, initial encounter: Secondary | ICD-10-CM

## 2017-06-24 DIAGNOSIS — L89312 Pressure ulcer of right buttock, stage 2: Secondary | ICD-10-CM | POA: Diagnosis not present

## 2017-06-24 DIAGNOSIS — I959 Hypotension, unspecified: Secondary | ICD-10-CM | POA: Diagnosis present

## 2017-06-24 DIAGNOSIS — M6281 Muscle weakness (generalized): Secondary | ICD-10-CM | POA: Diagnosis not present

## 2017-06-24 DIAGNOSIS — Z885 Allergy status to narcotic agent status: Secondary | ICD-10-CM

## 2017-06-24 LAB — BASIC METABOLIC PANEL
Anion gap: 7 (ref 5–15)
BUN: 64 mg/dL — AB (ref 6–20)
CALCIUM: 9.5 mg/dL (ref 8.9–10.3)
CHLORIDE: 109 mmol/L (ref 101–111)
CO2: 29 mmol/L (ref 22–32)
CREATININE: 3.5 mg/dL — AB (ref 0.61–1.24)
GFR calc non Af Amer: 14 mL/min — ABNORMAL LOW (ref >60)
GFR, EST AFRICAN AMERICAN: 17 mL/min — AB (ref >60)
GLUCOSE: 145 mg/dL — AB (ref 65–99)
Potassium: 4.1 mmol/L (ref 3.5–5.1)
Sodium: 145 mmol/L (ref 135–145)

## 2017-06-24 LAB — GLUCOSE, CAPILLARY
GLUCOSE-CAPILLARY: 97 mg/dL (ref 65–99)
Glucose-Capillary: 100 mg/dL — ABNORMAL HIGH (ref 65–99)

## 2017-06-24 LAB — CBC WITH DIFFERENTIAL/PLATELET
BASOS PCT: 0 %
Basophils Absolute: 0 10*3/uL (ref 0.0–0.1)
EOS ABS: 0.1 10*3/uL (ref 0.0–0.7)
Eosinophils Relative: 1 %
HEMATOCRIT: 33.4 % — AB (ref 39.0–52.0)
HEMOGLOBIN: 10.2 g/dL — AB (ref 13.0–17.0)
LYMPHS ABS: 1.7 10*3/uL (ref 0.7–4.0)
Lymphocytes Relative: 31 %
MCH: 30.3 pg (ref 26.0–34.0)
MCHC: 30.5 g/dL (ref 30.0–36.0)
MCV: 99.1 fL (ref 78.0–100.0)
MONOS PCT: 5 %
Monocytes Absolute: 0.3 10*3/uL (ref 0.1–1.0)
NEUTROS PCT: 63 %
Neutro Abs: 3.5 10*3/uL (ref 1.7–7.7)
Platelets: 168 10*3/uL (ref 150–400)
RBC: 3.37 MIL/uL — ABNORMAL LOW (ref 4.22–5.81)
RDW: 15.2 % (ref 11.5–15.5)
WBC: 5.6 10*3/uL (ref 4.0–10.5)

## 2017-06-24 LAB — URINALYSIS, ROUTINE W REFLEX MICROSCOPIC
Bilirubin Urine: NEGATIVE
GLUCOSE, UA: NEGATIVE mg/dL
Hgb urine dipstick: NEGATIVE
Ketones, ur: NEGATIVE mg/dL
LEUKOCYTES UA: NEGATIVE
NITRITE: NEGATIVE
PROTEIN: NEGATIVE mg/dL
Specific Gravity, Urine: 1.009 (ref 1.005–1.030)
pH: 6 (ref 5.0–8.0)

## 2017-06-24 LAB — ECHOCARDIOGRAM LIMITED
HEIGHTINCHES: 72 in
Weight: 3104.08 oz

## 2017-06-24 LAB — TROPONIN I: Troponin I: <0.03 ng/mL (ref <0.03)

## 2017-06-24 MED ORDER — ALPRAZOLAM 0.5 MG PO TABS: 0.5000 mg | ORAL_TABLET | Freq: Four times a day (QID) | ORAL | Status: DC | PRN

## 2017-06-24 MED ORDER — OMEGA-3-ACID ETHYL ESTERS 1 G PO CAPS
1.0000 g | ORAL_CAPSULE | Freq: Two times a day (BID) | ORAL | Status: DC
  Administered 2017-06-24 – 2017-06-25 (×3): 1 g via ORAL
  Filled 2017-06-24 (×7): qty 1

## 2017-06-24 MED ORDER — ASPIRIN EC 81 MG PO TBEC
81.0000 mg | DELAYED_RELEASE_TABLET | Freq: Every morning | ORAL | Status: DC
  Administered 2017-06-24 – 2017-06-25 (×2): 81 mg via ORAL
  Filled 2017-06-24 (×5): qty 1

## 2017-06-24 MED ORDER — TRAMADOL HCL 50 MG PO TABS: 50.0000 mg | ORAL_TABLET | Freq: Four times a day (QID) | ORAL | Status: DC | PRN

## 2017-06-24 MED ORDER — MEMANTINE HCL ER 28 MG PO CP24
28.0000 mg | ORAL_CAPSULE | Freq: Every day | ORAL | Status: DC
  Administered 2017-06-24 – 2017-06-27 (×2): 28 mg via ORAL
  Filled 2017-06-24 (×6): qty 1

## 2017-06-24 MED ORDER — SODIUM CHLORIDE 0.9% FLUSH
3.0000 mL | Freq: Two times a day (BID) | INTRAVENOUS | Status: DC
  Administered 2017-06-24 – 2017-06-28 (×6): 3 mL via INTRAVENOUS

## 2017-06-24 MED ORDER — ONDANSETRON HCL 4 MG PO TABS: 4.0000 mg | ORAL_TABLET | Freq: Four times a day (QID) | ORAL | Status: DC | PRN

## 2017-06-24 MED ORDER — VITAMIN B-12 1000 MCG PO TABS
1000.0000 ug | ORAL_TABLET | Freq: Every day | ORAL | Status: DC
  Administered 2017-06-24 – 2017-06-25 (×2): 1000 ug via ORAL
  Filled 2017-06-24 (×4): qty 1

## 2017-06-24 MED ORDER — ACETAMINOPHEN 325 MG PO TABS: 650.0000 mg | ORAL_TABLET | Freq: Four times a day (QID) | ORAL | Status: DC | PRN

## 2017-06-24 MED ORDER — SENNOSIDES-DOCUSATE SODIUM 8.6-50 MG PO TABS: 1.0000 | ORAL_TABLET | Freq: Every evening | ORAL | Status: DC | PRN

## 2017-06-24 MED ORDER — LORAZEPAM 2 MG/ML IJ SOLN
1.0000 mg | INTRAMUSCULAR | Status: DC | PRN
  Administered 2017-06-24 – 2017-06-27 (×9): 1 mg via INTRAVENOUS
  Filled 2017-06-24 (×9): qty 1

## 2017-06-24 MED ORDER — ACETAMINOPHEN 650 MG RE SUPP: 650.0000 mg | Freq: Four times a day (QID) | RECTAL | Status: DC | PRN

## 2017-06-24 MED ORDER — TAMSULOSIN HCL 0.4 MG PO CAPS
0.4000 mg | ORAL_CAPSULE | Freq: Every day | ORAL | Status: DC
  Administered 2017-06-24 – 2017-06-27 (×2): 0.4 mg via ORAL
  Filled 2017-06-24 (×3): qty 1

## 2017-06-24 MED ORDER — BISACODYL 5 MG PO TBEC: 5.0000 mg | DELAYED_RELEASE_TABLET | Freq: Every day | ORAL | Status: DC | PRN

## 2017-06-24 MED ORDER — ONDANSETRON HCL 4 MG/2ML IJ SOLN: 4.0000 mg | Freq: Four times a day (QID) | INTRAMUSCULAR | Status: DC | PRN

## 2017-06-24 MED ORDER — ADULT MULTIVITAMIN W/MINERALS CH
1.0000 | ORAL_TABLET | Freq: Every day | ORAL | Status: DC
  Administered 2017-06-24 – 2017-06-27 (×3): 1 via ORAL
  Filled 2017-06-24 (×5): qty 1

## 2017-06-24 MED ORDER — PANTOPRAZOLE SODIUM 40 MG PO TBEC
40.0000 mg | DELAYED_RELEASE_TABLET | Freq: Every evening | ORAL | Status: DC
  Administered 2017-06-24 – 2017-06-25 (×2): 40 mg via ORAL
  Filled 2017-06-24 (×2): qty 1

## 2017-06-24 MED ORDER — SODIUM CHLORIDE 0.9 % IV BOLUS (SEPSIS)
1000.0000 mL | Freq: Once | INTRAVENOUS | Status: AC
  Administered 2017-06-24: 1000 mL via INTRAVENOUS

## 2017-06-24 MED ORDER — LIDOCAINE 5 % EX PTCH: 1.0000 | MEDICATED_PATCH | Freq: Every day | CUTANEOUS | Status: DC | PRN

## 2017-06-24 MED ORDER — OXYCODONE-ACETAMINOPHEN 5-325 MG PO TABS
ORAL_TABLET | Freq: Four times a day (QID) | ORAL | Status: DC | PRN
  Administered 2017-06-24 – 2017-06-28 (×5): 1 via ORAL
  Filled 2017-06-24 (×6): qty 1

## 2017-06-24 MED ORDER — HEPARIN SODIUM (PORCINE) 5000 UNIT/ML IJ SOLN
5000.0000 [IU] | Freq: Three times a day (TID) | INTRAMUSCULAR | Status: DC
  Administered 2017-06-24 – 2017-06-27 (×12): 5000 [IU] via SUBCUTANEOUS
  Filled 2017-06-24 (×13): qty 1

## 2017-06-24 NOTE — ED Notes (Signed)
ED Provider at bedside. 

## 2017-06-24 NOTE — ED Provider Notes (Signed)
[facility] EMERGENCY DEPARTMENT Provider Note   CSN: [id number] Arrival date & time: 06/24/17  0201     History   Chief Complaint Chief Complaint  Patient presents with  . Fall  LEVEL 5 CAVEAT DUE TO DEMENTIA   HPI Devin Warner is a 83 y.o. male.  The history is provided by the patient and the spouse.  Fall  This is a new problem. Episode onset: just prior to arrival. The problem occurs constantly. The problem has not changed since onset.Nothing aggravates the symptoms. Nothing relieves the symptoms.   Patient arrives via EMS s/p fall Wife reports he was up tonight using his walker and fell It was unwitnessed EMS reports was initially hypotensive and was given IV fluids Per report he has h/o dementia  Past Medical History:  Diagnosis Date  . Arthritis   . Cancer (HCC)    Mouth and colon  . Coronary atherosclerosis of native coronary artery   . Hypertension   . Kidney stones   . Spinal stenosis, lumbar region, without neurogenic claudication   . Syncope and collapse     Patient Active Problem List   Diagnosis Date Noted  . Dysphagia, unspecified(787.20) 03/25/2014  . Rotator cuff tear, right 02/03/2014  . Bursitis, shoulder 08/01/2013  . Arthritis of knee, left 07/04/2012  . OA (osteoarthritis) of knee 05/30/2012  . Effusion of knee joint 05/30/2012  . Knee pain, right 05/30/2012  . WOUND, FINGER 11/08/2010  . CAD, NATIVE VESSEL 01/13/2010  . SPINAL STENOSIS, LUMBAR 01/29/2009    Past Surgical History:  Procedure Laterality Date  . BACK SURGERY    . BALLOON DILATION N/A 04/30/2014   Procedure: BALLOON DILATION;  Surgeon: Rogene Houston, MD;  Location: [facility];  Service: Endoscopy;  Laterality: N/A;  . CATARACT EXTRACTION    . CORONARY ARTERY BYPASS GRAFT    . ESOPHAGOGASTRODUODENOSCOPY  02/17/2006   with esophageal dilation followed by colonscopy   . ESOPHAGOGASTRODUODENOSCOPY N/A 04/30/2014   Procedure: ESOPHAGOGASTRODUODENOSCOPY (EGD);  Surgeon:  Rogene Houston, MD;  Location: [facility];  Service: Endoscopy;  Laterality: N/A;  255-moved to 100 Ann to notify pt  . HEMICOLECTOMY  04/03/2002  . MALONEY DILATION N/A 04/30/2014   Procedure: Venia Minks DILATION;  Surgeon: Rogene Houston, MD;  Location: [facility];  Service: Endoscopy;  Laterality: N/A;  . 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  10/09/01   Surgeon Laurita Quint   . SAVORY DILATION N/A 04/30/2014   Procedure: SAVORY DILATION;  Surgeon: Rogene Houston, MD;  Location: [facility];  Service: Endoscopy;  Laterality: N/A;    OB History    No data available       Home Medications    Prior to Admission medications   Medication Sig Start Date End Date Taking? Authorizing Provider  acetaminophen (TYLENOL) 500 MG tablet Take 1-2 tablets (500-1,000 mg total) by mouth every 6 (six) hours as needed for mild pain, fever or headache. 12/07/16   Mesner, Corene Cornea, MD  ALPRAZolam Duanne Moron) 0.5 MG tablet Take 0.5 mg by mouth 4 (four) times daily as needed for anxiety.     [provider]  aspirin EC 81 MG tablet Take 81 mg by mouth every morning.     [provider]  fenofibrate micronized (LOFIBRA) 134 MG capsule Take 134 mg by mouth every evening.     [provider]  furosemide (LASIX) 40 MG tablet TAKE 1 TABLET BY MOUTH IN THE MORNING AND TAKE  1/2 A TABLET IN THE EVENING Patient taking differently: TAKE 1 TABLET BY MOUTH IN THE MORNING AND TAKE 1 TABLET IN THE EVENING 12/01/16   Herminio Commons, MD  ibuprofen (ADVIL,MOTRIN) 400 MG tablet Take 1 tablet (400 mg total) by mouth every 6 (six) hours as needed. 05/03/16   Mesner, Corene Cornea, MD  KLOR-CON M20 20 MEQ tablet TAKE 1 TABLET (20 MEQ TOTAL) BY MOUTH DAILY. 12/01/16   Herminio Commons, MD  lidocaine (LIDODERM) 5 % Place 1 patch onto the skin daily as needed (for pain). Pt applies to back.   Remove & Discard patch within 12 hours or as directed by MD     [provider]  metoprolol  tartrate (LOPRESSOR) 25 MG tablet Take 25 mg by mouth every evening.     [provider]  Multiple Vitamin (MULTIVITAMIN WITH MINERALS) TABS tablet Take 1 tablet by mouth daily with lunch.    [provider]  NAMENDA XR 28 MG CP24 Take 28 mg by mouth at bedtime.     [provider]  nitroGLYCERIN (NITROSTAT) 0.4 MG SL tablet Place 0.4 mg under the tongue every 5 (five) minutes as needed for chest pain.    [provider]  omega-3 acid ethyl esters (LOVAZA) 1 G capsule Take 1 g by mouth 2 (two) times daily.     [provider]  oxyCODONE-acetaminophen (PERCOCET) 10-325 MG tablet Take 1 tablet by mouth every 6 (six) hours as needed for pain.    [provider]  pantoprazole (PROTONIX) 40 MG tablet Take 40 mg by mouth every evening.     [provider]  potassium chloride SA (KLOR-CON M20) 20 MEQ tablet Take 20 mEq by mouth daily with lunch.     [provider]  Tamsulosin HCl (FLOMAX) 0.4 MG CAPS Take 0.4 mg by mouth at bedtime.     [provider]  traMADol (ULTRAM) 50 MG tablet Take 50 mg by mouth every 6 (six) hours as needed for moderate pain.     [provider]  vitamin B-12 (CYANOCOBALAMIN) 1000 MCG tablet Take 1,000 mcg by mouth daily with lunch.     [provider]    Family History Family History  Problem Relation Age of Onset  . Coronary artery disease Unknown        family history   . Arthritis Unknown   . Cancer Unknown     Social History Social History  Substance Use Topics  . Smoking status: Former Smoker    Types: Cigarettes, Cigars, Pipe    Start date: 08/22/1940    Quit date: 08/22/1974  . Smokeless tobacco: Never Used  . Alcohol use No     Allergies   Morphine   Review of Systems Review of Systems  Unable to perform ROS: Dementia     Physical Exam Updated Vital Signs BP 114/70   Pulse 82   Temp 98.4 F (36.9 C) (Rectal)   Resp 18   SpO2 98%   Physical  Exam CONSTITUTIONAL: Elderly, no acute distress HEAD: Normocephalic/atraumatic EYES: EOMI/PERRL ENMT: Mucous membranes dry, ?dried blood noted, no other signs of facial trauma SPINE/BACK:entire spine nontender, No bruising/crepitance/stepoffs noted to spine CV: S1/S2 noted, no murmurs/rubs/gallops noted LUNGS: decreased BS noted bilaterally Chest - no bruising or tenderness ABDOMEN: soft, nontender  GU:no cva tenderness NEURO: Pt is awake/alert, appears confused, he can moves all extremities but appears weaker in his legs  EXTREMITIES: pulses normal/equal, full ROM, pelvis stable, All  other extremities/joints palpated/ranged and nontender SKIN: warm, color normal PSYCH: unable to assess  ED Treatments / Results  Labs (all labs ordered are listed, but only abnormal results are displayed) Labs Reviewed  BASIC METABOLIC PANEL - Abnormal; Notable for the following:       Result Value   Glucose, Bld 145 (*)    BUN 64 (*)    Creatinine, Ser 3.50 (*)    GFR calc non Af Amer 14 (*)    GFR calc Af Amer 17 (*)    All other components within normal limits  CBC WITH DIFFERENTIAL/PLATELET - Abnormal; Notable for the following:    RBC 3.37 (*)    Hemoglobin 10.2 (*)    HCT 33.4 (*)    All other components within normal limits  TROPONIN I  URINALYSIS, ROUTINE W REFLEX MICROSCOPIC    EKG  EKG Interpretation  Date/Time:  Saturday June 24 2017 02:28:36 EDT Ventricular Rate:  79 PR Interval:    QRS Duration: 108 QT Interval:  375 QTC Calculation: 430 R Axis:   48 Text Interpretation:  Sinus rhythm Borderline prolonged PR interval Low voltage, precordial leads Abnormal R-wave progression, early transition Borderline T abnormalities, anterior leads Confirmed by Ripley Fraise [phone]) on 06/24/2017 2:53:25 AM       Radiology Dg Chest 1 View  Result Date: 06/24/2017 CLINICAL DATA:  Tripped and fell on walker. Found down on RIGHT side. Head injury. EXAM: CHEST 1 VIEW COMPARISON:   Chest radiograph May 03, 2017 FINDINGS: Cardiac silhouette is mildly enlarged and unchanged. Tortuous calcified aorta. Status post median sternotomy for CABG. Low inspiratory examination with crowded vascular markings, mild vascular engorgement. Chronically elevated RIGHT hemidiaphragm with RIGHT lung base strandy densities. No pleural effusion. No pneumothorax. Osteopenia. Surgical clips in the included right abdomen compatible with cholecystectomy. IMPRESSION: Stable cardiomegaly with pulmonary vascular congestion. Bibasilar atelectasis. Electronically Signed   By: Elon Alas M.D.   On: 06/24/2017 03:30   Ct Head Wo Contrast  Result Date: 06/24/2017 CLINICAL DATA:  58-year-old male with fall. EXAM: CT HEAD WITHOUT CONTRAST CT CERVICAL SPINE WITHOUT CONTRAST TECHNIQUE: Multidetector CT imaging of the head and cervical spine was performed following the standard protocol without intravenous contrast. Multiplanar CT image reconstructions of the cervical spine were also generated. COMPARISON:  Head CT dated 12/07/2016 FINDINGS: CT HEAD FINDINGS Brain: There is moderate age-related atrophy and chronic microvascular ischemic changes. There is no acute intracranial hemorrhage. No mass effect or midline shift noted. No extra-axial fluid collection. Vascular: No hyperdense vessel or unexpected calcification. Skull: Normal. Negative for fracture or focal lesion. Sinuses/Orbits: No acute finding. Other: None CT CERVICAL SPINE FINDINGS Alignment: No acute subluxation. There is grade 1 C3-C4 anterolisthesis. Skull base and vertebrae: No acute fracture. No primary bone lesion or focal pathologic process. Soft tissues and spinal canal: No prevertebral fluid or swelling. No visible canal hematoma. Disc levels: Multilevel degenerative changes most prominent at C5-C6 where there is endplate irregularity and disc space narrowing. Multilevel facet hypertrophy and degenerative changes noted. Upper chest: Negative.  Other: Right thyroid nodule measures approximately 3 cm. Further evaluation with ultrasound recommended. Bilateral carotid bulb atherosclerotic plaques noted. IMPRESSION: 1. No acute intracranial hemorrhage. 2. Age-related atrophy and chronic microvascular ischemic changes. 3. No acute/traumatic cervical spine pathology. Multilevel degenerative changes. 4. Bilateral carotid bulb calcified plaques and right thyroid gland nodule. Electronically Signed   By: Anner Crete M.D.   On: 06/24/2017 03:41   Ct Cervical Spine Wo Contrast  Result Date: 06/24/2017  CLINICAL DATA:  67-year-old male with fall. EXAM: CT HEAD WITHOUT CONTRAST CT CERVICAL SPINE WITHOUT CONTRAST TECHNIQUE: Multidetector CT imaging of the head and cervical spine was performed following the standard protocol without intravenous contrast. Multiplanar CT image reconstructions of the cervical spine were also generated. COMPARISON:  Head CT dated 12/07/2016 FINDINGS: CT HEAD FINDINGS Brain: There is moderate age-related atrophy and chronic microvascular ischemic changes. There is no acute intracranial hemorrhage. No mass effect or midline shift noted. No extra-axial fluid collection. Vascular: No hyperdense vessel or unexpected calcification. Skull: Normal. Negative for fracture or focal lesion. Sinuses/Orbits: No acute finding. Other: None CT CERVICAL SPINE FINDINGS Alignment: No acute subluxation. There is grade 1 C3-C4 anterolisthesis. Skull base and vertebrae: No acute fracture. No primary bone lesion or focal pathologic process. Soft tissues and spinal canal: No prevertebral fluid or swelling. No visible canal hematoma. Disc levels: Multilevel degenerative changes most prominent at C5-C6 where there is endplate irregularity and disc space narrowing. Multilevel facet hypertrophy and degenerative changes noted. Upper chest: Negative. Other: Right thyroid nodule measures approximately 3 cm. Further evaluation with ultrasound recommended. Bilateral  carotid bulb atherosclerotic plaques noted. IMPRESSION: 1. No acute intracranial hemorrhage. 2. Age-related atrophy and chronic microvascular ischemic changes. 3. No acute/traumatic cervical spine pathology. Multilevel degenerative changes. 4. Bilateral carotid bulb calcified plaques and right thyroid gland nodule. Electronically Signed   By: Anner Crete M.D.   On: 06/24/2017 03:41    Procedures Procedures (including critical care time)  Medications Ordered in ED Medications  oxyCODONE-acetaminophen (PERCOCET) 10-325 MG per tablet 1 tablet (not administered)  aspirin EC tablet 81 mg (not administered)  lidocaine (LIDODERM) 5 % 1 patch (not administered)  multivitamin with minerals tablet 1 tablet (not administered)  memantine (NAMENDA XR) 24 hr capsule 28 mg (not administered)  pantoprazole (PROTONIX) EC tablet 40 mg (not administered)  vitamin B-12 (CYANOCOBALAMIN) tablet 1,000 mcg (not administered)  ALPRAZolam (XANAX) tablet 0.5 mg (not administered)  omega-3 acid ethyl esters (LOVAZA) capsule 1 g (not administered)  tamsulosin (FLOMAX) capsule 0.4 mg (not administered)  traMADol (ULTRAM) tablet 50 mg (not administered)  sodium chloride flush (NS) 0.9 % injection 3 mL (not administered)  heparin injection 5,000 Units (not administered)  acetaminophen (TYLENOL) tablet 650 mg (not administered)    Or  acetaminophen (TYLENOL) suppository 650 mg (not administered)  senna-docusate (Senokot-S) tablet 1 tablet (not administered)  bisacodyl (DULCOLAX) EC tablet 5 mg (not administered)  ondansetron (ZOFRAN) tablet 4 mg (not administered)    Or  ondansetron (ZOFRAN) injection 4 mg (not administered)  sodium chloride 0.9 % bolus 1,000 mL (1,000 mLs Intravenous New Bag/Given 06/24/17 0423)     Initial Impression / Assessment and Plan / ED Course  I have reviewed the triage vital signs and the nursing notes.  Pertinent labs & imaging results that were available during my care of the  patient were reviewed by me and considered in my medical decision making (see chart for details).     Pt from home s/p fall, of unclear etiology, could have been syncope Pt appears more alert and vitals improved BP (!) 130/96   Pulse 86   Temp 98.4 F (36.9 C) (Rectal)   Resp 16   SpO2 (!) 89%  However, he has acute on chronic renal failure All imaging negative  After discussion, will admit for rehydration D/w dr opyd for admission Pt stabilized in the ED   Final Clinical Impressions(s) / ED Diagnoses   Final diagnoses:  Dehydration  AKI (  acute kidney injury) ([place])  Concussion with loss of consciousness, initial encounter  Strain of neck muscle, initial encounter  Blunt chest trauma, initial encounter    New Prescriptions New Prescriptions   No medications on file     Ripley Fraise, MD 06/24/17 [phone]

## 2017-06-24 NOTE — Progress Notes (Signed)
  Echocardiogram 2D Echocardiogram has been performed.  Devin Warner 06/24/2017, 8:22 AM

## 2017-06-24 NOTE — Evaluation (Signed)
Physical Therapy Evaluation Patient Details Name: Devin Warner MRN: [id number] DOB: 01/28/1927 Today's Date: 06/24/2017   History of Present Illness  Devin Warner is a 83 y.o. male with medical history significant for coronary artery disease status post CABG, hypertension, chronic kidney disease stage IV, anxiety disorder, and presented to the emergency department after an unwitnessed fall at home with patient unable to recall what it happened.  Patient's wife found him down and called EMS.  Patient was found to have systolic blood pressure of 84 and was saturating in the mid 80s on room air upon EMS arrival.  He was placed on supplemental oxygen and transported to the hospital.  Patient does not recall what had occurred and is unable to contribute much to the history secondary to his underlying dementia.  Unfortunately, patient's wife at the bedside is a poor historian.    Clinical Impression  Patient presents on room air, demonstrates slow labored movement with near fall when attempting gait training, limited secondary to c/o fatigue, poor standing balance and O2 Saturation dropping to 81% on room air - RN notified and patient put on supplemental O2 and O2 Saturation increased to 98%.  Patient will benefit from continued physical therapy in hospital and recommended venue below to increase strength, balance, endurance for safe ADLs and gait.    Follow Up Recommendations SNF;Supervision/Assistance - 24 hour    Equipment Recommendations       Recommendations for Other Services       Precautions / Restrictions Precautions Precautions: Fall Restrictions Weight Bearing Restrictions: No      Mobility  Bed Mobility Overal bed mobility: Needs Assistance Bed Mobility: Sit to Supine;Supine to Sit     Supine to sit: Min guard Sit to supine: Min guard      Transfers Overall transfer level: Needs assistance Equipment used: Rolling walker (2 wheeled) Transfers: Sit to/from Stand Sit to  Stand: Mod assist            Ambulation/Gait Ambulation/Gait assistance: Mod assist;Max assist Ambulation Distance (Feet): 2 Feet Assistive device: Rolling walker (2 wheeled) Gait Pattern/deviations: Decreased step length - right;Decreased step length - left;Decreased stride length   Gait velocity interpretation: Below normal speed for age/gender General Gait Details: Patient limited to a 3-4 steps at bedside secondary to poor balance, BLE weakness and c/o fatigue  Stairs            Wheelchair Mobility    Modified Rankin (Stroke Patients Only)       Balance Overall balance assessment: Needs assistance Sitting-balance support: Feet supported;No upper extremity supported Sitting balance-Leahy Scale: Good     Standing balance support: Bilateral upper extremity supported;During functional activity Standing balance-Leahy Scale: Poor                               Pertinent Vitals/Pain Pain Assessment: No/denies pain    Home Living Family/patient expects to be discharged to:: Private residence Living Arrangements: Spouse/significant other Available Help at Discharge: Family Type of Home: House Home Access: Ramped entrance;Stairs to enter   Entrance Stairs-Number of Steps: 3, patient uses ramped entrance Home Layout: One level Home Equipment: Environmental consultant - 2 wheels;Cane - quad;Crutches;Wheelchair - manual;Tub bench      Prior Function Level of Independence: Independent with assistive device(s)               Hand Dominance        Extremity/Trunk Assessment   Upper  Extremity Assessment Upper Extremity Assessment: Generalized weakness    Lower Extremity Assessment Lower Extremity Assessment: Generalized weakness    Cervical / Trunk Assessment Cervical / Trunk Assessment: Normal  Communication   Communication: No difficulties  Cognition Arousal/Alertness: Awake/alert Behavior During Therapy: WFL for tasks assessed/performed Overall  Cognitive Status: Within Functional Limits for tasks assessed (patient impulsive)                                        General Comments      Exercises     Assessment/Plan    PT Assessment Patient needs continued PT services  PT Problem List Decreased strength;Decreased activity tolerance;Decreased balance;Decreased mobility       PT Treatment Interventions Gait training;Functional mobility training;Therapeutic activities;Therapeutic exercise;Patient/family education    PT Goals (Current goals can be found in the Care Plan section)  Acute Rehab PT Goals Patient Stated Goal: return home PT Goal Formulation: With patient/family Time For Goal Achievement: 06/30/17 Potential to Achieve Goals: Good    Frequency Min 3X/week   Barriers to discharge        Co-evaluation               AM-PAC PT "6 Clicks" Daily Activity  Outcome Measure Difficulty turning over in bed (including adjusting bedclothes, sheets and blankets)?: None Difficulty moving from lying on back to sitting on the side of the bed? : A Little Difficulty sitting down on and standing up from a chair with arms (e.g., wheelchair, bedside commode, etc,.)?: A Lot Help needed moving to and from a bed to chair (including a wheelchair)?: A Lot Help needed walking in hospital room?: A Lot Help needed climbing 3-5 steps with a railing? : Total 6 Click Score: 14    End of Session Equipment Utilized During Treatment: Gait belt Activity Tolerance: Patient limited by fatigue Patient left: in bed;with call bell/phone within reach;with bed alarm set;with family/visitor present Nurse Communication: Mobility status PT Visit Diagnosis: Unsteadiness on feet (R26.81);Other abnormalities of gait and mobility (R26.89);Muscle weakness (generalized) (M62.81)    Time: 6387-5643 PT Time Calculation (min) (ACUTE ONLY): 24 min   Charges:   PT Evaluation $PT Eval Moderate Complexity: 1 Mod PT  Treatments $Therapeutic Activity: 8-22 mins   PT G Codes:   PT G-Codes **NOT FOR INPATIENT CLASS** Functional Assessment Tool Used: AM-PAC 6 Clicks Basic Mobility Functional Limitation: Mobility: Walking and moving around Mobility: Walking and Moving Around Current Status (P2951): At least 40 percent but less than 60 percent impaired, limited or restricted Mobility: Walking and Moving Around Goal Status [phone]): At least 40 percent but less than 60 percent impaired, limited or restricted Mobility: Walking and Moving Around Discharge Status [phone]): At least 40 percent but less than 60 percent impaired, limited or restricted    9:10 AM, 06/24/17 Lonell Grandchild, MPT Physical Therapist with [facility] [phone] office [phone] mobile phone

## 2017-06-24 NOTE — H&P (Addendum)
History and Physical    Devin Warner:[id number] DOB: [birth year] DOA: 06/24/2017  PCP: Sinda Du, MD   Patient coming from: Home  Chief Complaint: Fall, ?syncope   HPI: Devin Warner is a 83 y.o. male with medical history significant for coronary artery disease status post CABG, hypertension, chronic kidney disease stage IV, anxiety disorder, and presented to the emergency department after an unwitnessed fall at home with patient unable to recall what it happened.  Patient's wife found him down and called EMS.  Patient was found to have systolic blood pressure of 84 and was saturating in the mid 80s on room air upon EMS arrival.  He was placed on supplemental oxygen and transported to the hospital.  Patient does not recall what had occurred and is unable to contribute much to the history secondary to his underlying dementia.  Unfortunately, patient's wife at the bedside is a poor historian.  ED Course: Upon arrival to the ED, patient is found to be afebrile, saturating 86% on room air, and with vitals otherwise stable.  EKG features a sinus rhythm with PVCs.  Chest x-ray is notable for stable cardiomegaly and pulmonary vascular congestion.  Noncontrast head CT is negative for acute intracranial abnormality and cervical spine CT is negative for acute cervical spine pathology.  Chemistry panel reveals a BUN of 64 and creatinine of 3.50, up from 2.3 over a year ago.  CBC is notable for normocytic anemia with hemoglobin of 10.2.  Troponin is undetectable and urinalysis is unremarkable.  Patient was treated with a liter of normal saline and supplement stable, has not been in any apparent respiratory distress, and he will be observed on the telemetry unit for ongoing evaluation and management an unwitnessed fall with inability to recall what took place, concerning for a syncopal episode.  Review of Systems:  Unable to complete ROS secondary to the patient's clinical condition with  dementia.  Past Medical History:  Diagnosis Date  . Arthritis   . Cancer (HCC)    Mouth and colon  . Coronary atherosclerosis of native coronary artery   . Hypertension   . Kidney stones   . Spinal stenosis, lumbar region, without neurogenic claudication   . Syncope and collapse     Past Surgical History:  Procedure Laterality Date  . BACK SURGERY    . BALLOON DILATION N/A 04/30/2014   Procedure: BALLOON DILATION;  Surgeon: Rogene Houston, MD;  Location: [facility];  Service: Endoscopy;  Laterality: N/A;  . CATARACT EXTRACTION    . CORONARY ARTERY BYPASS GRAFT    . ESOPHAGOGASTRODUODENOSCOPY  02/17/2006   with esophageal dilation followed by colonscopy   . ESOPHAGOGASTRODUODENOSCOPY N/A 04/30/2014   Procedure: ESOPHAGOGASTRODUODENOSCOPY (EGD);  Surgeon: Rogene Houston, MD;  Location: [facility];  Service: Endoscopy;  Laterality: N/A;  255-moved to 100 Ann to notify pt  . HEMICOLECTOMY  04/03/2002  . MALONEY DILATION N/A 04/30/2014   Procedure: Venia Minks DILATION;  Surgeon: Rogene Houston, MD;  Location: [facility];  Service: Endoscopy;  Laterality: N/A;  . 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  10/09/01   Surgeon Laurita Quint   . SAVORY DILATION N/A 04/30/2014   Procedure: SAVORY DILATION;  Surgeon: Rogene Houston, MD;  Location: [facility];  Service: Endoscopy;  Laterality: N/A;     reports that he quit smoking about 42 years ago. His smoking use included Cigarettes, Cigars, and Pipe. He started smoking about 76 years ago. He has never  used smokeless tobacco. He reports that he does not drink alcohol or use drugs.  Allergies  Allergen Reactions  . Morphine Hives and Anxiety    Family History  Problem Relation Age of Onset  . Coronary artery disease Unknown        family history   . Arthritis Unknown   . Cancer Unknown      Prior to Admission medications   Medication Sig Start Date End Date Taking? Authorizing Provider  acetaminophen (TYLENOL) 500  MG tablet Take 1-2 tablets (500-1,000 mg total) by mouth every 6 (six) hours as needed for mild pain, fever or headache. 12/07/16   Mesner, Corene Cornea, MD  ALPRAZolam Duanne Moron) 0.5 MG tablet Take 0.5 mg by mouth 4 (four) times daily as needed for anxiety.     [provider]  aspirin EC 81 MG tablet Take 81 mg by mouth every morning.     [provider]  fenofibrate micronized (LOFIBRA) 134 MG capsule Take 134 mg by mouth every evening.     [provider]  furosemide (LASIX) 40 MG tablet TAKE 1 TABLET BY MOUTH IN THE MORNING AND TAKE 1/2 A TABLET IN THE EVENING Patient taking differently: TAKE 1 TABLET BY MOUTH IN THE MORNING AND TAKE 1 TABLET IN THE EVENING 12/01/16   Herminio Commons, MD  ibuprofen (ADVIL,MOTRIN) 400 MG tablet Take 1 tablet (400 mg total) by mouth every 6 (six) hours as needed. 05/03/16   Mesner, Corene Cornea, MD  KLOR-CON M20 20 MEQ tablet TAKE 1 TABLET (20 MEQ TOTAL) BY MOUTH DAILY. 12/01/16   Herminio Commons, MD  lidocaine (LIDODERM) 5 % Place 1 patch onto the skin daily as needed (for pain). Pt applies to back.   Remove & Discard patch within 12 hours or as directed by MD     [provider]  metoprolol tartrate (LOPRESSOR) 25 MG tablet Take 25 mg by mouth every evening.     [provider]  Multiple Vitamin (MULTIVITAMIN WITH MINERALS) TABS tablet Take 1 tablet by mouth daily with lunch.    [provider]  NAMENDA XR 28 MG CP24 Take 28 mg by mouth at bedtime.     [provider]  nitroGLYCERIN (NITROSTAT) 0.4 MG SL tablet Place 0.4 mg under the tongue every 5 (five) minutes as needed for chest pain.    [provider]  omega-3 acid ethyl esters (LOVAZA) 1 G capsule Take 1 g by mouth 2 (two) times daily.     [provider]  oxyCODONE-acetaminophen (PERCOCET) 10-325 MG tablet Take 1 tablet by mouth every 6 (six) hours as needed for pain.    [provider]  pantoprazole (PROTONIX) 40 MG tablet  Take 40 mg by mouth every evening.     [provider]  potassium chloride SA (KLOR-CON M20) 20 MEQ tablet Take 20 mEq by mouth daily with lunch.     [provider]  Tamsulosin HCl (FLOMAX) 0.4 MG CAPS Take 0.4 mg by mouth at bedtime.     [provider]  traMADol (ULTRAM) 50 MG tablet Take 50 mg by mouth every 6 (six) hours as needed for moderate pain.     [provider]  vitamin B-12 (CYANOCOBALAMIN) 1000 MCG tablet Take 1,000 mcg by mouth daily with lunch.     [provider]    Physical Exam: Vitals:   06/24/17 0300 06/24/17 0400 06/24/17 0415 06/24/17 0430  BP: 114/70  128/63 (!) 130/96  Pulse: 82  86   Resp: 18 16    Temp:      TempSrc:      SpO2: 98%  (!) 89%       Constitutional: NAD, calm, appears uncomfortable Eyes: PERTLA, lids and conjunctivae normal ENMT: Mucous membranes are moist. Posterior pharynx clear of any exudate or lesions.   Neck: normal, supple, no masses, no thyromegaly Respiratory: clear to auscultation bilaterally, no wheezing, no crackles. Normal respiratory effort.  Cardiovascular: S1 & S2 heard, regular rate and rhythm. 1+ pretibial edema bilaterally. No significant JVD. Abdomen: No distension, no tenderness, no masses palpated. Bowel sounds active.  Musculoskeletal: no clubbing / cyanosis. No joint deformity upper and lower extremities.    Skin: no significant rashes, lesions, ulcers. Warm, dry, well-perfused. Neurologic: CN 2-12 grossly intact. Sensation intact, patellar DTR's normal. Strength 5/5 in all 4 limbs.  Psychiatric: Alert and oriented to person and place only. Calm, cooperative.     Labs on Admission: I have personally reviewed following labs and imaging studies  CBC:  Recent Labs Lab 06/24/17 0259  WBC 5.6  NEUTROABS 3.5  HGB 10.2*  HCT 33.4*  MCV 99.1  PLT 419   Basic Metabolic Panel:  Recent Labs Lab 06/24/17 0259  NA 145  K 4.1  CL 109  CO2 29  GLUCOSE 145*  BUN 64*   CREATININE 3.50*  CALCIUM 9.5   GFR: CrCl cannot be calculated (Unknown ideal weight.). Liver Function Tests: No results for input(s): AST, ALT, ALKPHOS, BILITOT, PROT, ALBUMIN in the last 168 hours. No results for input(s): LIPASE, AMYLASE in the last 168 hours. No results for input(s): AMMONIA in the last 168 hours. Coagulation Profile: No results for input(s): INR, PROTIME in the last 168 hours. Cardiac Enzymes:  Recent Labs Lab 06/24/17 0259  TROPONINI <0.03   BNP (last 3 results) No results for input(s): PROBNP in the last 8760 hours. HbA1C: No results for input(s): HGBA1C in the last 72 hours. CBG: No results for input(s): GLUCAP in the last 168 hours. Lipid Profile: No results for input(s): CHOL, HDL, LDLCALC, TRIG, CHOLHDL, LDLDIRECT in the last 72 hours. Thyroid Function Tests: No results for input(s): TSH, T4TOTAL, FREET4, T3FREE, THYROIDAB in the last 72 hours. Anemia Panel: No results for input(s): VITAMINB12, FOLATE, FERRITIN, TIBC, IRON, RETICCTPCT in the last 72 hours. Urine analysis:    Component Value Date/Time   COLORURINE YELLOW 06/24/2017 0304   APPEARANCEUR CLEAR 06/24/2017 0304   LABSPEC 1.009 06/24/2017 0304   PHURINE 6.0 06/24/2017 0304   GLUCOSEU NEGATIVE 06/24/2017 0304   HGBUR NEGATIVE 06/24/2017 0304   BILIRUBINUR NEGATIVE 06/24/2017 0304   KETONESUR NEGATIVE 06/24/2017 0304   PROTEINUR NEGATIVE 06/24/2017 0304   UROBILINOGEN 0.2 07/04/2008 1051   NITRITE NEGATIVE 06/24/2017 0304   LEUKOCYTESUR NEGATIVE 06/24/2017 0304   Sepsis Labs: @LABRCNTIP (procalcitonin:4,lacticidven:4) )No results found for this or any previous visit (from the past 240 hour(s)).   Radiological Exams on Admission: Dg Chest 1 View  Result Date: 06/24/2017 CLINICAL DATA:  Tripped and fell on walker. Found down on RIGHT side. Head injury. EXAM: CHEST 1 VIEW COMPARISON:  Chest radiograph May 03, 2017 FINDINGS: Cardiac silhouette is mildly enlarged and  unchanged. Tortuous calcified aorta. Status post median sternotomy for CABG. Low inspiratory examination with crowded vascular markings, mild vascular engorgement. Chronically elevated RIGHT hemidiaphragm with RIGHT lung base strandy densities. No pleural effusion. No pneumothorax. Osteopenia. Surgical clips in the included right abdomen compatible with cholecystectomy. IMPRESSION: Stable cardiomegaly with pulmonary vascular congestion. Bibasilar atelectasis.  Electronically Signed   By: Elon Alas M.D.   On: 06/24/2017 03:30   Ct Head Wo Contrast  Result Date: 06/24/2017 CLINICAL DATA:  78-year-old male with fall. EXAM: CT HEAD WITHOUT CONTRAST CT CERVICAL SPINE WITHOUT CONTRAST TECHNIQUE: Multidetector CT imaging of the head and cervical spine was performed following the standard protocol without intravenous contrast. Multiplanar CT image reconstructions of the cervical spine were also generated. COMPARISON:  Head CT dated 12/07/2016 FINDINGS: CT HEAD FINDINGS Brain: There is moderate age-related atrophy and chronic microvascular ischemic changes. There is no acute intracranial hemorrhage. No mass effect or midline shift noted. No extra-axial fluid collection. Vascular: No hyperdense vessel or unexpected calcification. Skull: Normal. Negative for fracture or focal lesion. Sinuses/Orbits: No acute finding. Other: None CT CERVICAL SPINE FINDINGS Alignment: No acute subluxation. There is grade 1 C3-C4 anterolisthesis. Skull base and vertebrae: No acute fracture. No primary bone lesion or focal pathologic process. Soft tissues and spinal canal: No prevertebral fluid or swelling. No visible canal hematoma. Disc levels: Multilevel degenerative changes most prominent at C5-C6 where there is endplate irregularity and disc space narrowing. Multilevel facet hypertrophy and degenerative changes noted. Upper chest: Negative. Other: Right thyroid nodule measures approximately 3 cm. Further evaluation with ultrasound  recommended. Bilateral carotid bulb atherosclerotic plaques noted. IMPRESSION: 1. No acute intracranial hemorrhage. 2. Age-related atrophy and chronic microvascular ischemic changes. 3. No acute/traumatic cervical spine pathology. Multilevel degenerative changes. 4. Bilateral carotid bulb calcified plaques and right thyroid gland nodule. Electronically Signed   By: Anner Crete M.D.   On: 06/24/2017 03:41   Ct Cervical Spine Wo Contrast  Result Date: 06/24/2017 CLINICAL DATA:  58-year-old male with fall. EXAM: CT HEAD WITHOUT CONTRAST CT CERVICAL SPINE WITHOUT CONTRAST TECHNIQUE: Multidetector CT imaging of the head and cervical spine was performed following the standard protocol without intravenous contrast. Multiplanar CT image reconstructions of the cervical spine were also generated. COMPARISON:  Head CT dated 12/07/2016 FINDINGS: CT HEAD FINDINGS Brain: There is moderate age-related atrophy and chronic microvascular ischemic changes. There is no acute intracranial hemorrhage. No mass effect or midline shift noted. No extra-axial fluid collection. Vascular: No hyperdense vessel or unexpected calcification. Skull: Normal. Negative for fracture or focal lesion. Sinuses/Orbits: No acute finding. Other: None CT CERVICAL SPINE FINDINGS Alignment: No acute subluxation. There is grade 1 C3-C4 anterolisthesis. Skull base and vertebrae: No acute fracture. No primary bone lesion or focal pathologic process. Soft tissues and spinal canal: No prevertebral fluid or swelling. No visible canal hematoma. Disc levels: Multilevel degenerative changes most prominent at C5-C6 where there is endplate irregularity and disc space narrowing. Multilevel facet hypertrophy and degenerative changes noted. Upper chest: Negative. Other: Right thyroid nodule measures approximately 3 cm. Further evaluation with ultrasound recommended. Bilateral carotid bulb atherosclerotic plaques noted. IMPRESSION: 1. No acute intracranial hemorrhage.  2. Age-related atrophy and chronic microvascular ischemic changes. 3. No acute/traumatic cervical spine pathology. Multilevel degenerative changes. 4. Bilateral carotid bulb calcified plaques and right thyroid gland nodule. Electronically Signed   By: Anner Crete M.D.   On: 06/24/2017 03:41    EKG: Independently reviewed. Sinus rhythm, PVC's.   Assessment/Plan  1. Syncope  - Pt presents following an unwitnessed fall, unable to recall what happened  - No focal neurologic deficits identified and head CT negative for acute intracranial abnormality  - Appeared to be hypovolemic and was treated with 1 liter NS in ED  - Plan to continue cardiac monitoring, check orthostatic vitals, and check echocardiogram    2.  CKD stage IV  - SCr is 3.50 on admission, up from 2.3 a year ago  - Suspect there is an acute component, but may reflect progression of his disease  - He was given 1 liter NS in ED  - Hold Lasix and Advil, repeat chem panel tomorrow    3. CAD - No anginal complaints  - Continue ASA and Lovaza; Lopressor held initially for low BP with EMS   4. Chronic diastolic CHF  - Pt appeared hypovolemic and was given 1 liter NS in ED  - Plan to hold Lasix, resume beta-blocker as BP allows, follow daily wts and I/O's    5. Dementia  - Stable, continue Namenda    6. Anxiety  - Continue prn Xanax    7. Chronic pain - No pain complaints  - Continue home regimen with lidocaine patch, prn tramadol, and prn Percocet     DVT prophylaxis: sq heparin  Code Status: Full  Family Communication: Discussed with patient Disposition Plan: Observe on telemetry Consults called: None Admission status: Observation    Vianne Bulls, MD [facility] Pager [phone]  If 7PM-7AM, please contact night-coverage [email or web address] Password [facility]  06/24/2017, 4:46 AM

## 2017-06-24 NOTE — ED Triage Notes (Signed)
Pt arrives via EMS. Per report by EMS, the pt wife reported that the pt wife reported that he was walking and got tripped up on his walker. On arrival by ems to the scene the pt was laying somewhat on his right side, concious and had hit his head. Initial pressure 82 palpated, saturations 84 percent on room air, last bp 84/31 and bilateral pin point pupils. Hx of dementia. Pt noted to have blood in his mouth on arrival, mae x 4.

## 2017-06-24 NOTE — Progress Notes (Signed)
Subjective: He was admitted early this morning after a fall. He is very confused. It has been suggested that he go to skilled care facility for rehabilitation. I discussed this with his wife at bedside and I'm not sure she really fully comprehends what I'm talking about. He is agitated and that's causing her to be distracted.  Objective: Vital signs in last 24 hours: Temp:  [97.6 F (36.4 C)-98.4 F (36.9 C)] 97.6 F (36.4 C) (11/03 0628) Pulse Rate:  [79-105] 105 (11/03 0628) Resp:  [13-19] 14 (11/03 0530) BP: (98-130)/(63-102) 119/63 (11/03 0628) SpO2:  [86 %-100 %] 99 % (11/03 0628) Weight:  [80.8 kg (178 lb 2.1 oz)-88 kg (194 lb 0.1 oz)] 88 kg (194 lb 0.1 oz) (11/03 0628) Weight change:  Last BM Date: 06/23/17  Intake/Output from previous day: 11/02 0701 - 11/03 0700 In: 2100 [P.O.:100; I.V.:1000; IV Piggyback:1000] Out: 1 [Urine:1]  PHYSICAL EXAM General appearance: alert and Agitated and confused Resp: clear to auscultation bilaterally Cardio: regular rate and rhythm, S1, S2 normal, no murmur, click, rub or gallop GI: soft, non-tender; bowel sounds normal; no masses,  no organomegaly Extremities: extremities normal, atraumatic, no cyanosis or edema Throat is clear. Edentulous  Lab Results:  Results for orders placed or performed during the hospital encounter of 06/24/17 (from the past 48 hour(s))  Basic metabolic panel     Status: Abnormal   Collection Time: 06/24/17  2:59 AM  Result Value Ref Range   Sodium 145 135 - 145 mmol/L   Potassium 4.1 3.5 - 5.1 mmol/L   Chloride 109 101 - 111 mmol/L   CO2 29 22 - 32 mmol/L   Glucose, Bld 145 (H) 65 - 99 mg/dL   BUN 64 (H) 6 - 20 mg/dL   Creatinine, Ser 3.50 (H) 0.61 - 1.24 mg/dL   Calcium 9.5 8.9 - 10.3 mg/dL   GFR calc non Af Amer 14 (L) >60 mL/min   GFR calc Af Amer 17 (L) >60 mL/min    Comment: (NOTE) The eGFR has been calculated using the CKD EPI equation. This calculation has not been validated in all clinical  situations. eGFR's persistently <60 mL/min signify possible Chronic Kidney Disease.    Anion gap 7 5 - 15  CBC with Differential/Platelet     Status: Abnormal   Collection Time: 06/24/17  2:59 AM  Result Value Ref Range   WBC 5.6 4.0 - 10.5 K/uL   RBC 3.37 (L) 4.22 - 5.81 MIL/uL   Hemoglobin 10.2 (L) 13.0 - 17.0 g/dL   HCT 33.4 (L) 39.0 - 52.0 %   MCV 99.1 78.0 - 100.0 fL   MCH 30.3 26.0 - 34.0 pg   MCHC 30.5 30.0 - 36.0 g/dL   RDW 15.2 11.5 - 15.5 %   Platelets 168 150 - 400 K/uL   Neutrophils Relative % 63 %   Neutro Abs 3.5 1.7 - 7.7 K/uL   Lymphocytes Relative 31 %   Lymphs Abs 1.7 0.7 - 4.0 K/uL   Monocytes Relative 5 %   Monocytes Absolute 0.3 0.1 - 1.0 K/uL   Eosinophils Relative 1 %   Eosinophils Absolute 0.1 0.0 - 0.7 K/uL   Basophils Relative 0 %   Basophils Absolute 0.0 0.0 - 0.1 K/uL  Troponin I     Status: None   Collection Time: 06/24/17  2:59 AM  Result Value Ref Range   Troponin I <0.03 <0.03 ng/mL  Urinalysis, Routine w reflex microscopic     Status: None     Collection Time: 06/24/17  3:04 AM  Result Value Ref Range   Color, Urine YELLOW YELLOW   APPearance CLEAR CLEAR   Specific Gravity, Urine 1.009 1.005 - 1.030   pH 6.0 5.0 - 8.0   Glucose, UA NEGATIVE NEGATIVE mg/dL   Hgb urine dipstick NEGATIVE NEGATIVE   Bilirubin Urine NEGATIVE NEGATIVE   Ketones, ur NEGATIVE NEGATIVE mg/dL   Protein, ur NEGATIVE NEGATIVE mg/dL   Nitrite NEGATIVE NEGATIVE   Leukocytes, UA NEGATIVE NEGATIVE  Glucose, capillary     Status: None   Collection Time: 06/24/17  6:22 AM  Result Value Ref Range   Glucose-Capillary 97 65 - 99 mg/dL   Comment 1 Notify RN    Comment 2 Document in Chart   Glucose, capillary     Status: Abnormal   Collection Time: 06/24/17  7:41 AM  Result Value Ref Range   Glucose-Capillary 100 (H) 65 - 99 mg/dL   Comment 1 Notify RN    Comment 2 Document in Chart     ABGS No results for input(s): PHART, PO2ART, TCO2, HCO3 in the last 72  hours.  Invalid input(s): PCO2 CULTURES No results found for this or any previous visit (from the past 240 hour(s)). Studies/Results: Dg Chest 1 View  Result Date: 06/24/2017 CLINICAL DATA:  Tripped and fell on walker. Found down on RIGHT side. Head injury. EXAM: CHEST 1 VIEW COMPARISON:  Chest radiograph May 03, 2017 FINDINGS: Cardiac silhouette is mildly enlarged and unchanged. Tortuous calcified aorta. Status post median sternotomy for CABG. Low inspiratory examination with crowded vascular markings, mild vascular engorgement. Chronically elevated RIGHT hemidiaphragm with RIGHT lung base strandy densities. No pleural effusion. No pneumothorax. Osteopenia. Surgical clips in the included right abdomen compatible with cholecystectomy. IMPRESSION: Stable cardiomegaly with pulmonary vascular congestion. Bibasilar atelectasis. Electronically Signed   By: Elon Alas M.D.   On: 06/24/2017 03:30   Ct Head Wo Contrast  Result Date: 06/24/2017 CLINICAL DATA:  83-year-old male with fall. EXAM: CT HEAD WITHOUT CONTRAST CT CERVICAL SPINE WITHOUT CONTRAST TECHNIQUE: Multidetector CT imaging of the head and cervical spine was performed following the standard protocol without intravenous contrast. Multiplanar CT image reconstructions of the cervical spine were also generated. COMPARISON:  Head CT dated 12/07/2016 FINDINGS: CT HEAD FINDINGS Brain: There is moderate age-related atrophy and chronic microvascular ischemic changes. There is no acute intracranial hemorrhage. No mass effect or midline shift noted. No extra-axial fluid collection. Vascular: No hyperdense vessel or unexpected calcification. Skull: Normal. Negative for fracture or focal lesion. Sinuses/Orbits: No acute finding. Other: None CT CERVICAL SPINE FINDINGS Alignment: No acute subluxation. There is grade 1 C3-C4 anterolisthesis. Skull base and vertebrae: No acute fracture. No primary bone lesion or focal pathologic process. Soft tissues and  spinal canal: No prevertebral fluid or swelling. No visible canal hematoma. Disc levels: Multilevel degenerative changes most prominent at C5-C6 where there is endplate irregularity and disc space narrowing. Multilevel facet hypertrophy and degenerative changes noted. Upper chest: Negative. Other: Right thyroid nodule measures approximately 3 cm. Further evaluation with ultrasound recommended. Bilateral carotid bulb atherosclerotic plaques noted. IMPRESSION: 1. No acute intracranial hemorrhage. 2. Age-related atrophy and chronic microvascular ischemic changes. 3. No acute/traumatic cervical spine pathology. Multilevel degenerative changes. 4. Bilateral carotid bulb calcified plaques and right thyroid gland nodule. Electronically Signed   By: Anner Crete M.D.   On: 06/24/2017 03:41   Ct Cervical Spine Wo Contrast  Result Date: 06/24/2017 CLINICAL DATA:  59-year-old male with fall. EXAM: CT HEAD WITHOUT CONTRAST  CT CERVICAL SPINE WITHOUT CONTRAST TECHNIQUE: Multidetector CT imaging of the head and cervical spine was performed following the standard protocol without intravenous contrast. Multiplanar CT image reconstructions of the cervical spine were also generated. COMPARISON:  Head CT dated 12/07/2016 FINDINGS: CT HEAD FINDINGS Brain: There is moderate age-related atrophy and chronic microvascular ischemic changes. There is no acute intracranial hemorrhage. No mass effect or midline shift noted. No extra-axial fluid collection. Vascular: No hyperdense vessel or unexpected calcification. Skull: Normal. Negative for fracture or focal lesion. Sinuses/Orbits: No acute finding. Other: None CT CERVICAL SPINE FINDINGS Alignment: No acute subluxation. There is grade 1 C3-C4 anterolisthesis. Skull base and vertebrae: No acute fracture. No primary bone lesion or focal pathologic process. Soft tissues and spinal canal: No prevertebral fluid or swelling. No visible canal hematoma. Disc levels: Multilevel degenerative  changes most prominent at C5-C6 where there is endplate irregularity and disc space narrowing. Multilevel facet hypertrophy and degenerative changes noted. Upper chest: Negative. Other: Right thyroid nodule measures approximately 3 cm. Further evaluation with ultrasound recommended. Bilateral carotid bulb atherosclerotic plaques noted. IMPRESSION: 1. No acute intracranial hemorrhage. 2. Age-related atrophy and chronic microvascular ischemic changes. 3. No acute/traumatic cervical spine pathology. Multilevel degenerative changes. 4. Bilateral carotid bulb calcified plaques and right thyroid gland nodule. Electronically Signed   By: Arash  Radparvar M.D.   On: 06/24/2017 03:41    Medications:  Prior to Admission:  Prescriptions Prior to Admission  Medication Sig Dispense Refill Last Dose  . acetaminophen (TYLENOL) 500 MG tablet Take 1-2 tablets (500-1,000 mg total) by mouth every 6 (six) hours as needed for mild pain, fever or headache. 30 tablet 0   . ALPRAZolam (XANAX) 0.5 MG tablet Take 0.5 mg by mouth 4 (four) times daily as needed for anxiety.    12/06/2016 at Unknown time  . aspirin EC 81 MG tablet Take 81 mg by mouth every morning.    12/07/2016 at Unknown time  . fenofibrate micronized (LOFIBRA) 134 MG capsule Take 134 mg by mouth every evening.    12/06/2016 at Unknown time  . furosemide (LASIX) 40 MG tablet TAKE 1 TABLET BY MOUTH IN THE MORNING AND TAKE 1/2 A TABLET IN THE EVENING (Patient taking differently: TAKE 1 TABLET BY MOUTH IN THE MORNING AND TAKE 1 TABLET IN THE EVENING) 135 tablet 3 12/07/2016 at Unknown time  . ibuprofen (ADVIL,MOTRIN) 400 MG tablet Take 1 tablet (400 mg total) by mouth every 6 (six) hours as needed. 30 tablet 0 Past Month at Unknown time  . KLOR-CON M20 20 MEQ tablet TAKE 1 TABLET (20 MEQ TOTAL) BY MOUTH DAILY. 90 tablet 3 12/06/2016 at Unknown time  . lidocaine (LIDODERM) 5 % Place 1 patch onto the skin daily as needed (for pain). Pt applies to back.   Remove & Discard  patch within 12 hours or as directed by MD    Past Week at Unknown time  . metoprolol tartrate (LOPRESSOR) 25 MG tablet Take 25 mg by mouth every evening.    12/06/2016 at 1800  . Multiple Vitamin (MULTIVITAMIN WITH MINERALS) TABS tablet Take 1 tablet by mouth daily with lunch.   12/06/2016 at Unknown time  . NAMENDA XR 28 MG CP24 Take 28 mg by mouth at bedtime.   12 12/06/2016 at Unknown time  . nitroGLYCERIN (NITROSTAT) 0.4 MG SL tablet Place 0.4 mg under the tongue every 5 (five) minutes as needed for chest pain.   Past Week at Unknown time  . omega-3 acid ethyl esters (LOVAZA)   1 G capsule Take 1 g by mouth 2 (two) times daily.    12/07/2016 at Unknown time  . oxyCODONE-acetaminophen (PERCOCET) 10-325 MG tablet Take 1 tablet by mouth every 6 (six) hours as needed for pain.   12/06/2016 at Unknown time  . pantoprazole (PROTONIX) 40 MG tablet Take 40 mg by mouth every evening.    12/07/2016 at Unknown time  . potassium chloride SA (KLOR-CON M20) 20 MEQ tablet Take 20 mEq by mouth daily with lunch.    12/06/2016 at Unknown time  . Tamsulosin HCl (FLOMAX) 0.4 MG CAPS Take 0.4 mg by mouth at bedtime.    12/06/2016 at Unknown time  . traMADol (ULTRAM) 50 MG tablet Take 50 mg by mouth every 6 (six) hours as needed for moderate pain.    Past Month at Unknown time  . vitamin B-12 (CYANOCOBALAMIN) 1000 MCG tablet Take 1,000 mcg by mouth daily with lunch.    12/06/2016 at Unknown time   Scheduled: . aspirin EC  81 mg Oral q morning - 10a  . heparin  5,000 Units Subcutaneous Q8H  . memantine  28 mg Oral QHS  . multivitamin with minerals  1 tablet Oral Q lunch  . omega-3 acid ethyl esters  1 g Oral BID  . pantoprazole  40 mg Oral QPM  . sodium chloride flush  3 mL Intravenous Q12H  . tamsulosin  0.4 mg Oral QHS  . vitamin B-12  1,000 mcg Oral Q lunch   Continuous:  [id number] **OR** acetaminophen, ALPRAZolam, bisacodyl, lidocaine, ondansetron **OR** ondansetron (ZOFRAN) IV, oxyCODONE-acetaminophen,  senna-docusate, traMADol  Assesment: He was admitted with what seems to be syncope. He has chronic kidney disease and his kidney function is worse. He appeared to be volume depleted on admission. He has coronary artery disease and chronic diastolic heart failure so we have to be careful with fluid replacement. He has dementia at baseline and he is very agitated now. Although he is confused he has no complaints of pain in any of his joints but he is complaining of back pain Principal Problem:   Syncope Active Problems:   CAD, NATIVE VESSEL   CKD (chronic kidney disease), stage IV (HCC)   Normocytic anemia   Dementia   Chronic diastolic CHF (congestive heart failure) (HCC)   Hypoxia   Anxiety disorder   Chronic back pain   Pressure injury of skin    Plan: I think skilled care facility would be helpful but I'm not sure that his wife will agree to do that. He's going to need something for his agitation. Continue with current treatments for now.    LOS: 0 days   Lum Stillinger L 06/24/2017, 10:20 AM

## 2017-06-24 NOTE — ED Notes (Signed)
Patient transported to CT 

## 2017-06-25 LAB — GLUCOSE, CAPILLARY: Glucose-Capillary: 97 mg/dL (ref 65–99)

## 2017-06-25 NOTE — Progress Notes (Signed)
Subjective: He is still confused. He is very weak.  Objective: Vital signs in last 24 hours: Temp:  [97.9 F (36.6 C)-98 F (36.7 C)] 98 F (36.7 C) (11/04 0557) Pulse Rate:  [78-95] 78 (11/04 0557) Resp:  [14-16] 16 (11/04 0557) BP: (102-122)/(58-71) 122/68 (11/04 0557) SpO2:  [97 %-98 %] 98 % (11/04 0557) Weight:  [89 kg (196 lb 3.4 oz)] 89 kg (196 lb 3.4 oz) (11/04 0700) Weight change: 8.2 kg (18 lb 1.2 oz) Last BM Date: 06/23/17  Intake/Output from previous day: 11/03 0701 - 11/04 0700 In: -  Out: 1200 [Urine:1200]  PHYSICAL EXAM General appearance: alert and no distress Resp: clear to auscultation bilaterally Cardio: regular rate and rhythm, S1, S2 normal, no murmur, click, rub or gallop GI: soft, non-tender; bowel sounds normal; no masses,  no organomegaly Extremities: extremities normal, atraumatic, no cyanosis or edema Skin warm and dry he is confused  Lab Results:  Results for orders placed or performed during the hospital encounter of 06/24/17 (from the past 48 hour(s))  Basic metabolic panel     Status: Abnormal   Collection Time: 06/24/17  2:59 AM  Result Value Ref Range   Sodium 145 135 - 145 mmol/L   Potassium 4.1 3.5 - 5.1 mmol/L   Chloride 109 101 - 111 mmol/L   CO2 29 22 - 32 mmol/L   Glucose, Bld 145 (H) 65 - 99 mg/dL   BUN 64 (H) 6 - 20 mg/dL   Creatinine, Ser 3.50 (H) 0.61 - 1.24 mg/dL   Calcium 9.5 8.9 - 10.3 mg/dL   GFR calc non Af Amer 14 (L) >60 mL/min   GFR calc Af Amer 17 (L) >60 mL/min    Comment: (NOTE) The eGFR has been calculated using the CKD EPI equation. This calculation has not been validated in all clinical situations. eGFR's persistently <60 mL/min signify possible Chronic Kidney Disease.    Anion gap 7 5 - 15  CBC with Differential/Platelet     Status: Abnormal   Collection Time: 06/24/17  2:59 AM  Result Value Ref Range   WBC 5.6 4.0 - 10.5 K/uL   RBC 3.37 (L) 4.22 - 5.81 MIL/uL   Hemoglobin 10.2 (L) 13.0 - 17.0 g/dL   HCT 33.4 (L) 39.0 - 52.0 %   MCV 99.1 78.0 - 100.0 fL   MCH 30.3 26.0 - 34.0 pg   MCHC 30.5 30.0 - 36.0 g/dL   RDW 15.2 11.5 - 15.5 %   Platelets 168 150 - 400 K/uL   Neutrophils Relative % 63 %   Neutro Abs 3.5 1.7 - 7.7 K/uL   Lymphocytes Relative 31 %   Lymphs Abs 1.7 0.7 - 4.0 K/uL   Monocytes Relative 5 %   Monocytes Absolute 0.3 0.1 - 1.0 K/uL   Eosinophils Relative 1 %   Eosinophils Absolute 0.1 0.0 - 0.7 K/uL   Basophils Relative 0 %   Basophils Absolute 0.0 0.0 - 0.1 K/uL  Troponin I     Status: None   Collection Time: 06/24/17  2:59 AM  Result Value Ref Range   Troponin I <0.03 <0.03 ng/mL  Urinalysis, Routine w reflex microscopic     Status: None   Collection Time: 06/24/17  3:04 AM  Result Value Ref Range   Color, Urine YELLOW YELLOW   APPearance CLEAR CLEAR   Specific Gravity, Urine 1.009 1.005 - 1.030   pH 6.0 5.0 - 8.0   Glucose, UA NEGATIVE NEGATIVE mg/dL   Hgb urine  dipstick NEGATIVE NEGATIVE   Bilirubin Urine NEGATIVE NEGATIVE   Ketones, ur NEGATIVE NEGATIVE mg/dL   Protein, ur NEGATIVE NEGATIVE mg/dL   Nitrite NEGATIVE NEGATIVE   Leukocytes, UA NEGATIVE NEGATIVE  Glucose, capillary     Status: None   Collection Time: 06/24/17  6:22 AM  Result Value Ref Range   Glucose-Capillary 97 65 - 99 mg/dL   Comment 1 Notify RN    Comment 2 Document in Chart   Glucose, capillary     Status: Abnormal   Collection Time: 06/24/17  7:41 AM  Result Value Ref Range   Glucose-Capillary 100 (H) 65 - 99 mg/dL   Comment 1 Notify RN    Comment 2 Document in Chart   Glucose, capillary     Status: None   Collection Time: 06/25/17  5:43 AM  Result Value Ref Range   Glucose-Capillary 97 65 - 99 mg/dL    ABGS No results for input(s): PHART, PO2ART, TCO2, HCO3 in the last 72 hours.  Invalid input(s): PCO2 CULTURES No results found for this or any previous visit (from the past 240 hour(s)). Studies/Results: Dg Chest 1 View  Result Date: 06/24/2017 CLINICAL DATA:   Tripped and fell on walker. Found down on RIGHT side. Head injury. EXAM: CHEST 1 VIEW COMPARISON:  Chest radiograph May 03, 2017 FINDINGS: Cardiac silhouette is mildly enlarged and unchanged. Tortuous calcified aorta. Status post median sternotomy for CABG. Low inspiratory examination with crowded vascular markings, mild vascular engorgement. Chronically elevated RIGHT hemidiaphragm with RIGHT lung base strandy densities. No pleural effusion. No pneumothorax. Osteopenia. Surgical clips in the included right abdomen compatible with cholecystectomy. IMPRESSION: Stable cardiomegaly with pulmonary vascular congestion. Bibasilar atelectasis. Electronically Signed   By: Elon Alas M.D.   On: 06/24/2017 03:30   Ct Head Wo Contrast  Result Date: 06/24/2017 CLINICAL DATA:  31-year-old male with fall. EXAM: CT HEAD WITHOUT CONTRAST CT CERVICAL SPINE WITHOUT CONTRAST TECHNIQUE: Multidetector CT imaging of the head and cervical spine was performed following the standard protocol without intravenous contrast. Multiplanar CT image reconstructions of the cervical spine were also generated. COMPARISON:  Head CT dated 12/07/2016 FINDINGS: CT HEAD FINDINGS Brain: There is moderate age-related atrophy and chronic microvascular ischemic changes. There is no acute intracranial hemorrhage. No mass effect or midline shift noted. No extra-axial fluid collection. Vascular: No hyperdense vessel or unexpected calcification. Skull: Normal. Negative for fracture or focal lesion. Sinuses/Orbits: No acute finding. Other: None CT CERVICAL SPINE FINDINGS Alignment: No acute subluxation. There is grade 1 C3-C4 anterolisthesis. Skull base and vertebrae: No acute fracture. No primary bone lesion or focal pathologic process. Soft tissues and spinal canal: No prevertebral fluid or swelling. No visible canal hematoma. Disc levels: Multilevel degenerative changes most prominent at C5-C6 where there is endplate irregularity and disc space  narrowing. Multilevel facet hypertrophy and degenerative changes noted. Upper chest: Negative. Other: Right thyroid nodule measures approximately 3 cm. Further evaluation with ultrasound recommended. Bilateral carotid bulb atherosclerotic plaques noted. IMPRESSION: 1. No acute intracranial hemorrhage. 2. Age-related atrophy and chronic microvascular ischemic changes. 3. No acute/traumatic cervical spine pathology. Multilevel degenerative changes. 4. Bilateral carotid bulb calcified plaques and right thyroid gland nodule. Electronically Signed   By: Anner Crete M.D.   On: 06/24/2017 03:41   Ct Cervical Spine Wo Contrast  Result Date: 06/24/2017 CLINICAL DATA:  77-year-old male with fall. EXAM: CT HEAD WITHOUT CONTRAST CT CERVICAL SPINE WITHOUT CONTRAST TECHNIQUE: Multidetector CT imaging of the head and cervical spine was performed following the  standard protocol without intravenous contrast. Multiplanar CT image reconstructions of the cervical spine were also generated. COMPARISON:  Head CT dated 12/07/2016 FINDINGS: CT HEAD FINDINGS Brain: There is moderate age-related atrophy and chronic microvascular ischemic changes. There is no acute intracranial hemorrhage. No mass effect or midline shift noted. No extra-axial fluid collection. Vascular: No hyperdense vessel or unexpected calcification. Skull: Normal. Negative for fracture or focal lesion. Sinuses/Orbits: No acute finding. Other: None CT CERVICAL SPINE FINDINGS Alignment: No acute subluxation. There is grade 1 C3-C4 anterolisthesis. Skull base and vertebrae: No acute fracture. No primary bone lesion or focal pathologic process. Soft tissues and spinal canal: No prevertebral fluid or swelling. No visible canal hematoma. Disc levels: Multilevel degenerative changes most prominent at C5-C6 where there is endplate irregularity and disc space narrowing. Multilevel facet hypertrophy and degenerative changes noted. Upper chest: Negative. Other: Right thyroid  nodule measures approximately 3 cm. Further evaluation with ultrasound recommended. Bilateral carotid bulb atherosclerotic plaques noted. IMPRESSION: 1. No acute intracranial hemorrhage. 2. Age-related atrophy and chronic microvascular ischemic changes. 3. No acute/traumatic cervical spine pathology. Multilevel degenerative changes. 4. Bilateral carotid bulb calcified plaques and right thyroid gland nodule. Electronically Signed   By: Anner Crete M.D.   On: 06/24/2017 03:41    Medications:  Prior to Admission:  Medications Prior to Admission  Medication Sig Dispense Refill Last Dose  . ALPRAZolam (XANAX) 0.5 MG tablet Take 0.5 mg by mouth 4 (four) times daily as needed for anxiety.    unknown  . fenofibrate micronized (LOFIBRA) 134 MG capsule Take 134 mg by mouth every evening.    unknown  . furosemide (LASIX) 40 MG tablet TAKE 1 TABLET BY MOUTH IN THE MORNING AND TAKE 1/2 A TABLET IN THE EVENING (Patient taking differently: TAKE 1 TABLET BY MOUTH IN THE MORNING AND TAKE 1 TABLET IN THE EVENING) 135 tablet 3 unknown  . KLOR-CON M20 20 MEQ tablet TAKE 1 TABLET (20 MEQ TOTAL) BY MOUTH DAILY. 90 tablet 3 unknown  . metoprolol tartrate (LOPRESSOR) 25 MG tablet Take 25 mg by mouth every evening.    unknown  . NAMENDA XR 28 MG CP24 Take 28 mg by mouth at bedtime.   12 unknown at Unknown time  . omega-3 acid ethyl esters (LOVAZA) 1 G capsule Take 1 g by mouth 4 (four) times daily.    unknown  . pantoprazole (PROTONIX) 40 MG tablet Take 40 mg by mouth every evening.    unknown  . potassium chloride SA (KLOR-CON M20) 20 MEQ tablet Take 20 mEq by mouth daily with lunch.    unknown at Unknown time  . Tamsulosin HCl (FLOMAX) 0.4 MG CAPS Take 0.4 mg by mouth at bedtime.    unknown  . acetaminophen (TYLENOL) 500 MG tablet Take 1-2 tablets (500-1,000 mg total) by mouth every 6 (six) hours as needed for mild pain, fever or headache. 30 tablet 0   . aspirin EC 81 MG tablet Take 81 mg by mouth every morning.     12/07/2016 at Unknown time  . lidocaine (LIDODERM) 5 % Place 1 patch onto the skin daily as needed (for pain). Pt applies to back.   Remove & Discard patch within 12 hours or as directed by MD    Past Week at Unknown time  . Multiple Vitamin (MULTIVITAMIN WITH MINERALS) TABS tablet Take 1 tablet by mouth daily with lunch.   12/06/2016 at Unknown time  . nitroGLYCERIN (NITROSTAT) 0.4 MG SL tablet Place 0.4 mg under the tongue  every 5 (five) minutes as needed for chest pain.   Past Week at Unknown time  . oxyCODONE-acetaminophen (PERCOCET) 10-325 MG tablet Take 1 tablet by mouth every 6 (six) hours as needed for pain.   12/06/2016 at Unknown time  . promethazine (PHENERGAN) 12.5 MG tablet Take 1 tablet by mouth 4 (four) times daily as needed for nausea/vomiting.  5   . vitamin B-12 (CYANOCOBALAMIN) 1000 MCG tablet Take 1,000 mcg by mouth daily with lunch.    12/06/2016 at Unknown time   Scheduled: . aspirin EC  81 mg Oral q morning - 10a  . heparin  5,000 Units Subcutaneous Q8H  . memantine  28 mg Oral QHS  . multivitamin with minerals  1 tablet Oral Q lunch  . omega-3 acid ethyl esters  1 g Oral BID  . pantoprazole  40 mg Oral QPM  . sodium chloride flush  3 mL Intravenous Q12H  . tamsulosin  0.4 mg Oral QHS  . vitamin B-12  1,000 mcg Oral Q lunch   Continuous:  FCZ:[id number] **OR** acetaminophen, bisacodyl, lidocaine, LORazepam, ondansetron **OR** ondansetron (ZOFRAN) IV, oxyCODONE-acetaminophen, senna-docusate, traMADol  Assesment: He was admitted with syncope. He has chronic back pain. He's not doing well in general. It has been recommended that he have skilled care facility placement but I don't think his wife is going to agree to that. Principal Problem:   Syncope Active Problems:   CAD, NATIVE VESSEL   CKD (chronic kidney disease), stage IV (HCC)   Normocytic anemia   Dementia   Chronic diastolic CHF (congestive heart failure) (HCC)   Hypoxia   Anxiety disorder   Chronic back  pain   Pressure injury of skin    Plan: Continue treatments. He's better as far as his agitation is concerned. Probable discharge in the morning    LOS: 0 days   Yusuke Beza L 06/25/2017, 10:23 AM

## 2017-06-26 DIAGNOSIS — M48061 Spinal stenosis, lumbar region without neurogenic claudication: Secondary | ICD-10-CM | POA: Diagnosis present

## 2017-06-26 DIAGNOSIS — I5032 Chronic diastolic (congestive) heart failure: Secondary | ICD-10-CM | POA: Diagnosis present

## 2017-06-26 DIAGNOSIS — G8929 Other chronic pain: Secondary | ICD-10-CM | POA: Diagnosis present

## 2017-06-26 DIAGNOSIS — D631 Anemia in chronic kidney disease: Secondary | ICD-10-CM | POA: Diagnosis present

## 2017-06-26 DIAGNOSIS — Z87891 Personal history of nicotine dependence: Secondary | ICD-10-CM | POA: Diagnosis not present

## 2017-06-26 DIAGNOSIS — F419 Anxiety disorder, unspecified: Secondary | ICD-10-CM | POA: Diagnosis present

## 2017-06-26 DIAGNOSIS — F05 Delirium due to known physiological condition: Secondary | ICD-10-CM | POA: Diagnosis present

## 2017-06-26 DIAGNOSIS — R55 Syncope and collapse: Secondary | ICD-10-CM | POA: Diagnosis present

## 2017-06-26 DIAGNOSIS — I130 Hypertensive heart and chronic kidney disease with heart failure and stage 1 through stage 4 chronic kidney disease, or unspecified chronic kidney disease: Secondary | ICD-10-CM | POA: Diagnosis present

## 2017-06-26 DIAGNOSIS — N179 Acute kidney failure, unspecified: Secondary | ICD-10-CM | POA: Diagnosis present

## 2017-06-26 DIAGNOSIS — N184 Chronic kidney disease, stage 4 (severe): Secondary | ICD-10-CM | POA: Diagnosis present

## 2017-06-26 DIAGNOSIS — Z85819 Personal history of malignant neoplasm of unspecified site of lip, oral cavity, and pharynx: Secondary | ICD-10-CM | POA: Diagnosis not present

## 2017-06-26 DIAGNOSIS — Z9049 Acquired absence of other specified parts of digestive tract: Secondary | ICD-10-CM | POA: Diagnosis not present

## 2017-06-26 DIAGNOSIS — Z85038 Personal history of other malignant neoplasm of large intestine: Secondary | ICD-10-CM | POA: Diagnosis not present

## 2017-06-26 DIAGNOSIS — M549 Dorsalgia, unspecified: Secondary | ICD-10-CM | POA: Diagnosis present

## 2017-06-26 DIAGNOSIS — W19XXXA Unspecified fall, initial encounter: Secondary | ICD-10-CM | POA: Diagnosis present

## 2017-06-26 DIAGNOSIS — I2510 Atherosclerotic heart disease of native coronary artery without angina pectoris: Secondary | ICD-10-CM | POA: Diagnosis present

## 2017-06-26 DIAGNOSIS — F0390 Unspecified dementia without behavioral disturbance: Secondary | ICD-10-CM | POA: Diagnosis present

## 2017-06-26 DIAGNOSIS — L89312 Pressure ulcer of right buttock, stage 2: Secondary | ICD-10-CM | POA: Diagnosis present

## 2017-06-26 DIAGNOSIS — I959 Hypotension, unspecified: Secondary | ICD-10-CM | POA: Diagnosis present

## 2017-06-26 DIAGNOSIS — R0902 Hypoxemia: Secondary | ICD-10-CM | POA: Diagnosis present

## 2017-06-26 DIAGNOSIS — E860 Dehydration: Secondary | ICD-10-CM | POA: Diagnosis present

## 2017-06-26 DIAGNOSIS — E861 Hypovolemia: Secondary | ICD-10-CM | POA: Diagnosis present

## 2017-06-26 DIAGNOSIS — S161XXA Strain of muscle, fascia and tendon at neck level, initial encounter: Secondary | ICD-10-CM | POA: Diagnosis present

## 2017-06-26 DIAGNOSIS — Z951 Presence of aortocoronary bypass graft: Secondary | ICD-10-CM | POA: Diagnosis not present

## 2017-06-26 LAB — GLUCOSE, CAPILLARY
GLUCOSE-CAPILLARY: 118 mg/dL — AB (ref 65–99)
GLUCOSE-CAPILLARY: 102 mg/dL — AB (ref 65–99)
GLUCOSE-CAPILLARY: 110 mg/dL — AB (ref 65–99)
Glucose-Capillary: 123 mg/dL — ABNORMAL HIGH (ref 65–99)

## 2017-06-26 LAB — BASIC METABOLIC PANEL
Anion gap: 9 (ref 5–15)
BUN: 48 mg/dL — AB (ref 6–20)
CHLORIDE: 113 mmol/L — AB (ref 101–111)
CO2: 30 mmol/L (ref 22–32)
Calcium: 10 mg/dL (ref 8.9–10.3)
Creatinine, Ser: 2.13 mg/dL — ABNORMAL HIGH (ref 0.61–1.24)
GFR calc Af Amer: 30 mL/min — ABNORMAL LOW (ref >60)
GFR calc non Af Amer: 26 mL/min — ABNORMAL LOW (ref >60)
Glucose, Bld: 121 mg/dL — ABNORMAL HIGH (ref 65–99)
POTASSIUM: 3.5 mmol/L (ref 3.5–5.1)
SODIUM: 152 mmol/L — AB (ref 135–145)

## 2017-06-26 NOTE — Progress Notes (Signed)
Physical Therapy Treatment Patient Details Name: Devin Warner MRN: [id number] DOB: 1927-05-14 Today's Date: 06/26/2017    History of Present Illness Devin Warner is a 83 y.o. male with medical history significant for coronary artery disease status post CABG, hypertension, chronic kidney disease stage IV, anxiety disorder, and presented to the emergency department after an unwitnessed fall at home with patient unable to recall what it happened.  Patient's wife found him down and called EMS.  Patient was found to have systolic blood pressure of 84 and was saturating in the mid 80s on room air upon EMS arrival.  He was placed on supplemental oxygen and transported to the hospital.  Patient does not recall what had occurred and is unable to contribute much to the history secondary to his underlying dementia.  Unfortunately, patient's wife at the bedside is a poor historian.    PT Comments    Patient limited for activity today due to c/o severe pain in BLE, back, and shoulders with movement and/or pressure, tolerated sitting up at bedside for approximately 20 minutes, but unable to stand due to BLE weakness/pain.  Patient will benefit from continued physical therapy in hospital and recommended venue below to increase strength, balance, endurance for safe ADLs and gait.   Follow Up Recommendations  SNF;Supervision/Assistance - 24 hour     Equipment Recommendations  None recommended by PT    Recommendations for Other Services       Precautions / Restrictions Precautions Precautions: Fall Restrictions Weight Bearing Restrictions: No    Mobility  Bed Mobility Overal bed mobility: Needs Assistance Bed Mobility: Sit to Supine;Supine to Sit     Supine to sit: Mod assist Sit to supine: Mod assist      Transfers                    Ambulation/Gait                 Stairs            Wheelchair Mobility    Modified Rankin (Stroke Patients Only)        Balance Overall balance assessment: Needs assistance Sitting-balance support: Feet supported;Bilateral upper extremity supported Sitting balance-Leahy Scale: Fair                                      Cognition Arousal/Alertness: Awake/alert Behavior During Therapy: WFL for tasks assessed/performed Overall Cognitive Status: Within Functional Limits for tasks assessed                                 General Comments: patient requires repeated verbal/tactile cueing to complete tasks      Exercises General Exercises - Lower Extremity Long Arc Quad: Seated;AAROM;Strengthening;Both;10 reps Hip Flexion/Marching: Seated;AAROM;Strengthening;Both;10 reps    General Comments        Pertinent Vitals/Pain Pain Assessment: Faces Faces Pain Scale: Hurts whole lot Pain Location: BLE, shoulders Pain Descriptors / Indicators: Aching;Pressure;Sharp Pain Intervention(s): Limited activity within patient's tolerance;Monitored during session    Home Living                      Prior Function            PT Goals (current goals can now be found in the care plan section) Acute Rehab PT Goals Patient Stated Goal:  return home PT Goal Formulation: With patient/family Time For Goal Achievement: 06/30/17 Potential to Achieve Goals: Fair Progress towards PT goals: Progressing toward goals    Frequency    Min 3X/week      PT Plan Current plan remains appropriate    Co-evaluation              AM-PAC PT "6 Clicks" Daily Activity  Outcome Measure  Difficulty turning over in bed (including adjusting bedclothes, sheets and blankets)?: A Lot Difficulty moving from lying on back to sitting on the side of the bed? : A Lot Difficulty sitting down on and standing up from a chair with arms (e.g., wheelchair, bedside commode, etc,.)?: Unable Help needed moving to and from a bed to chair (including a wheelchair)?: Total Help needed walking in hospital  room?: Total Help needed climbing 3-5 steps with a railing? : Total 6 Click Score: 8    End of Session Equipment Utilized During Treatment: Gait belt Activity Tolerance: Patient limited by fatigue;Patient limited by pain Patient left: in bed;with bed alarm set;with call bell/phone within reach;with family/visitor present(mittons reapplied to hands) Nurse Communication: Mobility status PT Visit Diagnosis: Unsteadiness on feet (R26.81);Other abnormalities of gait and mobility (R26.89);Muscle weakness (generalized) (M62.81)     Time: 4481-8563 PT Time Calculation (min) (ACUTE ONLY): 23 min  Charges:  $Therapeutic Activity: 23-37 mins                    G Codes:  Functional Assessment Tool Used: AM-PAC 6 Clicks Basic Mobility Functional Limitation: Mobility: Walking and moving around Mobility: Walking and Moving Around Current Status ([id number]): At least 80 percent but less than 100 percent impaired, limited or restricted Mobility: Walking and Moving Around Goal Status [phone]): At least 80 percent but less than 100 percent impaired, limited or restricted Mobility: Walking and Moving Around Discharge Status [phone]): At least 80 percent but less than 100 percent impaired, limited or restricted    3:56 PM, 06/26/17 Lonell Grandchild, MPT Physical Therapist with [facility] [phone] office [phone] mobile phone

## 2017-06-26 NOTE — Evaluation (Signed)
Clinical/Bedside Swallow Evaluation Patient Details  Name: Devin Warner MRN: [id number] Date of Birth: Feb 28, 1927  Today's Date: 06/26/2017 Time: SLP Start Time (ACUTE ONLY): 67 SLP Stop Time (ACUTE ONLY): 1147 SLP Time Calculation (min) (ACUTE ONLY): 17 min  Past Medical History:  Past Medical History:  Diagnosis Date  . Arthritis   . Cancer (HCC)    Mouth and colon  . Coronary atherosclerosis of native coronary artery   . Hypertension   . Kidney stones   . Spinal stenosis, lumbar region, without neurogenic claudication   . Syncope and collapse    Past Surgical History:  Past Surgical History:  Procedure Laterality Date  . BACK SURGERY    . CATARACT EXTRACTION    . CORONARY ARTERY BYPASS GRAFT    . ESOPHAGOGASTRODUODENOSCOPY  02/17/2006   with esophageal dilation followed by colonscopy   . HEMICOLECTOMY  04/03/2002  . 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  10/09/01   Surgeon Laurita Quint    HPI:  He was admitted with what seems to be syncope. He has chronic kidney disease and his kidney function is worse. He appeared to be volume depleted on admission. He has coronary artery disease and chronic diastolic heart failure so we have to be careful with fluid replacement. He has dementia at baseline and he is very agitated now. Although he is confused he has no complaints of pain in any of his joints but he is complaining of back pain. BSE ordered   Assessment / Plan / Recommendation Clinical Impression  Pt sedated over night due to agitation and was extremely difficult to rouse for BSE. Pt with brief periods of alertness and able follow simple commands with delay. Pt only swallowed two ice chips, however decreased alertness prevented additional po trials. D/W RN, recommend holding meds and po until sufficiently alert. SLP will check  back later today for improved alertness. SLP Visit Diagnosis: Dysphagia, unspecified (R13.10)    Aspiration Risk  Risk for inadequate  nutrition/hydration    Diet Recommendation (Unable to fully assess due to lethargy; hold po if not alert)        Other  Recommendations Oral Care Recommendations: Oral care BID;Staff/trained caregiver to provide oral care Other Recommendations: Clarify dietary restrictions   Follow up Recommendations Skilled Nursing facility      Frequency and Duration min 2x/week  1 week       Prognosis Prognosis for Safe Diet Advancement: Fair      Swallow Study   General Date of Onset: 06/24/17 HPI: He was admitted with what seems to be syncope. He has chronic kidney disease and his kidney function is worse. He appeared to be volume depleted on admission. He has coronary artery disease and chronic diastolic heart failure so we have to be careful with fluid replacement. He has dementia at baseline and he is very agitated now. Although he is confused he has no complaints of pain in any of his joints but he is complaining of back pain. BSE ordered Type of Study: Bedside Swallow Evaluation Previous Swallow Assessment: None on record Diet Prior to this Study: Regular;Thin liquids Temperature Spikes Noted: No Respiratory Status: Nasal cannula History of Recent Intubation: No Behavior/Cognition: Lethargic/Drowsy Oral Cavity Assessment: Within Functional Limits Oral Care Completed by SLP: Yes Oral Cavity - Dentition: Dentures, top;Dentures, bottom Self-Feeding Abilities: Total assist Patient Positioning: Upright in bed Baseline Vocal Quality: Not observed Volitional Cough: Cognitively unable to elicit Volitional Swallow: Unable to elicit    Oral/Motor/Sensory Function  Overall Oral Motor/Sensory Function: (difficult to fully assess due to lethargy)   Ice Chips Ice chips: Impaired Presentation: Spoon Oral Phase Impairments: Reduced labial seal;Reduced lingual movement/coordination   Thin Liquid Thin Liquid: Impaired Presentation: Cup Oral Phase Impairments: Reduced labial seal;Poor awareness of  bolus Pharyngeal  Phase Impairments: Suspected delayed Swallow    Nectar Thick Nectar Thick Liquid: Not tested   Honey Thick Honey Thick Liquid: Not tested   Puree Puree: Not tested   Solid   Thank you,  Devin Warner, CCC-SLP [phone]    Solid: Not tested        Devin Warner 06/26/2017,12:11 PM

## 2017-06-26 NOTE — Progress Notes (Signed)
  Speech Language Pathology Treatment: Dysphagia  Patient Details Name: Devin Warner MRN: [id number] DOB: 04/30/1927 Today's Date: 06/26/2017 Time: 0623-7628 SLP Time Calculation (min) (ACUTE ONLY): 29 min  Assessment / Plan / Recommendation Clinical Impression  SLP observed family attempting to feed Pt lunch tray, so reassessment of swallowing and education completed with family. Pt with slight improvement in alertness. He was repositioned fully upright. Pt shows signs of decreased airway protection characterized by immediate cough following thin liquids via cup and straw. Mech soft textures had to be removed with toothette. Improved performance noted with puree and NTL via cup sips (hand over hand assist). Recommend downgrading to D1/puree with NTL, no straws until Pt closer to baseline. Recommend SNF follow up. SLP will follow while in acute setting.    HPI HPI: He was admitted with what seems to be syncope. He has chronic kidney disease and his kidney function is worse. He appeared to be volume depleted on admission. He has coronary artery disease and chronic diastolic heart failure so we have to be careful with fluid replacement. He has dementia at baseline and he is very agitated now. Although he is confused he has no complaints of pain in any of his joints but he is complaining of back pain. BSE ordered      SLP Plan  Continue with current plan of care       Recommendations  Diet recommendations: Dysphagia 1 (puree);Nectar-thick liquid Liquids provided via: Cup Medication Administration: Crushed with puree Supervision: Staff to assist with self feeding;Full supervision/cueing for compensatory strategies Compensations: Slow rate;Minimize environmental distractions;Small sips/bites;Lingual sweep for clearance of pocketing Postural Changes and/or Swallow Maneuvers: Seated upright 90 degrees;Upright 30-60 min after meal                Oral Care Recommendations: Oral care  BID;Staff/trained caregiver to provide oral care Follow up Recommendations: Skilled Nursing facility Plan: Continue with current plan of care       Thank you,  Genene Churn, [place]                [place] 06/26/2017, 4:56 PM

## 2017-06-26 NOTE — Progress Notes (Addendum)
Subjective: He is sleeping quietly now.  No new complaints.  Echocardiogram did not show much change from previous studies.  Objective: Vital signs in last 24 hours: Temp:  [97.7 F (36.5 C)-97.9 F (36.6 C)] 97.7 F (36.5 C) (11/05 0700) Pulse Rate:  [88-99] 99 (11/05 0700) Resp:  [17-18] 18 (11/05 0700) BP: (113-139)/(69-100) 113/100 (11/05 0700) SpO2:  [81 %-100 %] 81 % (11/05 0700) Weight:  [87.6 kg (193 lb 2 oz)] 87.6 kg (193 lb 2 oz) (11/05 0500) Weight change: -1.4 kg (-1.4 oz) Last BM Date: 06/25/17  Intake/Output from previous day: 11/04 0701 - 11/05 0700 In: 903 [P.O.:900; I.V.:3] Out: 2300 [Urine:2300]  PHYSICAL EXAM General appearance: Sleepy but arousable.  Confused. Resp: clear to auscultation bilaterally Cardio: regular rate and rhythm, S1, S2 normal, no murmur, click, rub or gallop GI: soft, non-tender; bowel sounds normal; no masses,  no organomegaly Extremities: extremities normal, atraumatic, no cyanosis or edema Skin turgor fair  Lab Results:  Results for orders placed or performed during the hospital encounter of 06/24/17 (from the past 48 hour(s))  Glucose, capillary     Status: None   Collection Time: 06/25/17  5:43 AM  Result Value Ref Range   Glucose-Capillary 97 65 - 99 mg/dL  Glucose, capillary     Status: Abnormal   Collection Time: 06/26/17  6:57 AM  Result Value Ref Range   Glucose-Capillary 118 (H) 65 - 99 mg/dL   Comment 1 Notify RN    Comment 2 Document in Chart   Glucose, capillary     Status: Abnormal   Collection Time: 06/26/17  7:30 AM  Result Value Ref Range   Glucose-Capillary 123 (H) 65 - 99 mg/dL    ABGS No results for input(s): PHART, PO2ART, TCO2, HCO3 in the last 72 hours.  Invalid input(s): PCO2 CULTURES No results found for this or any previous visit (from the past 240 hour(s)). Studies/Results: No results found.  Medications:  Prior to Admission:  Medications Prior to Admission  Medication Sig Dispense Refill  Last Dose  . ALPRAZolam (XANAX) 0.5 MG tablet Take 0.5 mg by mouth 4 (four) times daily as needed for anxiety.    unknown  . fenofibrate micronized (LOFIBRA) 134 MG capsule Take 134 mg by mouth every evening.    unknown  . furosemide (LASIX) 40 MG tablet TAKE 1 TABLET BY MOUTH IN THE MORNING AND TAKE 1/2 A TABLET IN THE EVENING (Patient taking differently: TAKE 1 TABLET BY MOUTH IN THE MORNING AND TAKE 1 TABLET IN THE EVENING) 135 tablet 3 unknown  . KLOR-CON M20 20 MEQ tablet TAKE 1 TABLET (20 MEQ TOTAL) BY MOUTH DAILY. 90 tablet 3 unknown  . metoprolol tartrate (LOPRESSOR) 25 MG tablet Take 25 mg by mouth every evening.    unknown  . NAMENDA XR 28 MG CP24 Take 28 mg by mouth at bedtime.   12 unknown at Unknown time  . omega-3 acid ethyl esters (LOVAZA) 1 G capsule Take 1 g by mouth 4 (four) times daily.    unknown  . pantoprazole (PROTONIX) 40 MG tablet Take 40 mg by mouth every evening.    unknown  . potassium chloride SA (KLOR-CON M20) 20 MEQ tablet Take 20 mEq by mouth daily with lunch.    unknown at Unknown time  . Tamsulosin HCl (FLOMAX) 0.4 MG CAPS Take 0.4 mg by mouth at bedtime.    unknown  . acetaminophen (TYLENOL) 500 MG tablet Take 1-2 tablets (500-1,000 mg total) by mouth every  6 (six) hours as needed for mild pain, fever or headache. 30 tablet 0   . aspirin EC 81 MG tablet Take 81 mg by mouth every morning.    12/07/2016 at Unknown time  . lidocaine (LIDODERM) 5 % Place 1 patch onto the skin daily as needed (for pain). Pt applies to back.   Remove & Discard patch within 12 hours or as directed by MD    Past Week at Unknown time  . Multiple Vitamin (MULTIVITAMIN WITH MINERALS) TABS tablet Take 1 tablet by mouth daily with lunch.   12/06/2016 at Unknown time  . nitroGLYCERIN (NITROSTAT) 0.4 MG SL tablet Place 0.4 mg under the tongue every 5 (five) minutes as needed for chest pain.   Past Week at Unknown time  . oxyCODONE-acetaminophen (PERCOCET) 10-325 MG tablet Take 1 tablet by mouth  every 6 (six) hours as needed for pain.   12/06/2016 at Unknown time  . promethazine (PHENERGAN) 12.5 MG tablet Take 1 tablet by mouth 4 (four) times daily as needed for nausea/vomiting.  5   . vitamin B-12 (CYANOCOBALAMIN) 1000 MCG tablet Take 1,000 mcg by mouth daily with lunch.    12/06/2016 at Unknown time   Scheduled: . aspirin EC  81 mg Oral q morning - 10a  . heparin  5,000 Units Subcutaneous Q8H  . memantine  28 mg Oral QHS  . multivitamin with minerals  1 tablet Oral Q lunch  . omega-3 acid ethyl esters  1 g Oral BID  . pantoprazole  40 mg Oral QPM  . sodium chloride flush  3 mL Intravenous Q12H  . tamsulosin  0.4 mg Oral QHS  . vitamin B-12  1,000 mcg Oral Q lunch   Continuous:  EZM:[id number] **OR** acetaminophen, bisacodyl, lidocaine, LORazepam, ondansetron **OR** ondansetron (ZOFRAN) IV, oxyCODONE-acetaminophen, senna-docusate, traMADol  Assesment: He was admitted with syncope.  He has multiple other medical problems including coronary artery occlusive disease, chronic kidney disease stage IV, dementia, anxiety, and had chronic diastolic heart failure but his echocardiogram does not show diastolic systolic dysfunction. Principal Problem:   Syncope Active Problems:   CAD, NATIVE VESSEL   CKD (chronic kidney disease), stage IV (HCC)   Normocytic anemia   Dementia   Chronic diastolic CHF (congestive heart failure) (HCC)   Hypoxia   Anxiety disorder   Chronic back pain   Pressure injury of skin    Plan: Continue current treatments.  Check basic metabolic profile.  Discontinue cardiac monitoring because he keeps pulling it off.  I think he would be best served at a skilled care facility but his wife has refused that.  Discussed with daughter who has power of attorney    LOS: 0 days   Zykiria Bruening L 06/26/2017, 8:44 AM

## 2017-06-26 NOTE — Progress Notes (Signed)
Kept trying to rip it off.

## 2017-06-27 LAB — GLUCOSE, CAPILLARY
GLUCOSE-CAPILLARY: 87 mg/dL (ref 65–99)
GLUCOSE-CAPILLARY: 149 mg/dL — AB (ref 65–99)
Glucose-Capillary: 94 mg/dL (ref 65–99)
Glucose-Capillary: 148 mg/dL — ABNORMAL HIGH (ref 65–99)

## 2017-06-27 NOTE — Clinical Social Work Note (Signed)
LCSW spoke with patient's daughter and discussed bed offers. LCSW discussed that if patient was going to need custodial/long term care that he would have to have another payor source such as long term care insurance, long term Medicaid or private pay. Patient's daughter stated that she would look in to the long term care medicaid.   LCSW discussed that the current placement would be for short term rehab only.  LCSW received [employer] authorization [phone] for seven days.    Fotini Lemus, Clydene Pugh, LCSW

## 2017-06-27 NOTE — NC FL2 (Signed)
[place] MEDICAID FL2 LEVEL OF CARE SCREENING TOOL     IDENTIFICATION  Patient Name: Devin Warner Birthdate: 11-23-1926 Sex: adult Admission Date (Current Location): 06/24/2017  [facility] and [place] Number:  [employer] and Address:  [facility] [address] [place]      Provider Number: [id number]  Attending Physician Name and Address:  Sinda Du, MD  Relative Name and Phone Number:       Current Level of Care: Hospital Recommended Level of Care: [place] Prior Approval Number:    Date Approved/Denied:   PASRR Number:    Discharge Plan: SNF    Current Diagnoses: Patient Active Problem List   Diagnosis Date Noted  . CKD (chronic kidney disease), stage IV ([place]) 06/24/2017  . Normocytic anemia 06/24/2017  . Dementia 06/24/2017  . Chronic diastolic CHF (congestive heart failure) ([place]) 06/24/2017  . Hypoxia 06/24/2017  . Syncope 06/24/2017  . Anxiety disorder 06/24/2017  . Chronic back pain 06/24/2017  . Pressure injury of skin 06/24/2017  . Dysphagia, unspecified(787.20) 03/25/2014  . Rotator cuff tear, right 02/03/2014  . Bursitis, shoulder 08/01/2013  . Arthritis of knee, left 07/04/2012  . OA (osteoarthritis) of knee 05/30/2012  . Effusion of knee joint 05/30/2012  . Knee pain, right 05/30/2012  . WOUND, FINGER 11/08/2010  . CAD, NATIVE VESSEL 01/13/2010  . SPINAL STENOSIS, LUMBAR 01/29/2009    Orientation RESPIRATION BLADDER Height & Weight     Self  O2(3L) Incontinent Weight: 192 lb 3.9 oz (87.2 kg) Height:  6' (182.9 cm)  BEHAVIORAL SYMPTOMS/MOOD NEUROLOGICAL BOWEL NUTRITION STATUS      Incontinent Diet(DYS 1. Fluid Consistency: Nectar Thick)  AMBULATORY STATUS COMMUNICATION OF NEEDS Skin   Extensive Assist Verbally PU Stage and Appropriate Care(rt buttocks)                       Personal Care Assistance Level of Assistance  Bathing, Feeding, Dressing Bathing Assistance: Limited  assistance Feeding assistance: Independent Dressing Assistance: Limited assistance     Functional Limitations Info  Sight, Hearing, Speech Sight Info: Adequate Hearing Info: Adequate Speech Info: Adequate    SPECIAL CARE FACTORS FREQUENCY  PT (By licensed PT)     PT Frequency: 5x/week              Contractures Contractures Info: Not present    Additional Factors Info  Allergies, Psychotropic   Allergies Info: Morphine Psychotropic Info: Xanax         Current Medications (06/27/2017):  This is the current hospital active medication list Current Facility-Administered Medications  Medication Dose Route Frequency Provider Last Rate Last Dose  . acetaminophen (TYLENOL) tablet 650 mg  650 mg Oral Q6H PRN Opyd, Ilene Qua, MD       Or  . acetaminophen (TYLENOL) suppository 650 mg  650 mg Rectal Q6H PRN Opyd, Ilene Qua, MD      . aspirin EC tablet 81 mg  81 mg Oral q morning - 10a Opyd, Ilene Qua, MD   81 mg at 06/25/17 1026  . bisacodyl (DULCOLAX) EC tablet 5 mg  5 mg Oral Daily PRN Opyd, Ilene Qua, MD      . heparin injection 5,000 Units  5,000 Units Subcutaneous Q8H Opyd, Ilene Qua, MD   5,000 Units at 06/27/17 [phone]  . lidocaine (LIDODERM) 5 % 1 patch  1 patch Transdermal Daily PRN Opyd, Ilene Qua, MD      . LORazepam (ATIVAN)  injection 1 mg  1 mg Intravenous Q4H PRN Sinda Du, MD   1 mg at 06/26/17 2039  . memantine (NAMENDA XR) 24 hr capsule 28 mg  28 mg Oral QHS Opyd, Ilene Qua, MD   28 mg at 06/24/17 2252  . multivitamin with minerals tablet 1 tablet  1 tablet Oral Q lunch Opyd, Ilene Qua, MD   1 tablet at 06/25/17 1221  . omega-3 acid ethyl esters (LOVAZA) capsule 1 g  1 g Oral BID Opyd, Ilene Qua, MD   1 g at 06/25/17 1026  . ondansetron (ZOFRAN) tablet 4 mg  4 mg Oral Q6H PRN Opyd, Ilene Qua, MD       Or  . ondansetron (ZOFRAN) injection 4 mg  4 mg Intravenous Q6H PRN Opyd, Ilene Qua, MD      . oxyCODONE-acetaminophen (PERCOCET/ROXICET) 5-325 MG per tablet    Oral Q6H PRN Opyd, Ilene Qua, MD   1 tablet at 06/25/17 1616  . pantoprazole (PROTONIX) EC tablet 40 mg  40 mg Oral QPM Opyd, Ilene Qua, MD   40 mg at 06/25/17 1749  . senna-docusate (Senokot-S) tablet 1 tablet  1 tablet Oral QHS PRN Opyd, Ilene Qua, MD      . sodium chloride flush (NS) 0.9 % injection 3 mL  3 mL Intravenous Q12H Opyd, Ilene Qua, MD   3 mL at 06/26/17 2040  . tamsulosin (FLOMAX) capsule 0.4 mg  0.4 mg Oral QHS Opyd, Ilene Qua, MD   0.4 mg at 06/24/17 2250  . traMADol (ULTRAM) tablet 50 mg  50 mg Oral Q6H PRN Opyd, Ilene Qua, MD      . vitamin B-12 (CYANOCOBALAMIN) tablet 1,000 mcg  1,000 mcg Oral Q lunch Opyd, Ilene Qua, MD   1,000 mcg at 06/25/17 1221     Discharge Medications: Please see discharge summary for a list of discharge medications.  Relevant Imaging Results:  Relevant Lab Results:   Additional Information SSN [id number] Clydene Pugh, LCSW

## 2017-06-27 NOTE — Clinical Social Work Note (Signed)
Clinical Social Work Assessment  Patient Details  Name: Devin Warner MRN: [id number] Date of Birth: 1927/01/25  Date of referral:  06/27/17               Reason for consult:                   Permission sought to share information with:    Permission granted to share information::     Name::        Agency::     Relationship::     Contact Information:  Devin Warner, daughter; Spouse, Devin Warner  Housing/Transportation Living arrangements for the past 2 months:  Single Family Home Source of Information:  Spouse, Adult Children Patient Interpreter Needed:  None Criminal Activity/Legal Involvement Pertinent to Current Situation/Hospitalization:  No - Comment as needed Significant Relationships:  Adult Children, Spouse Lives with:  Spouse Do you feel safe going back to the place where you live?  Yes Need for family participation in patient care:  Yes (Comment)  Care giving concerns:  Patient and his wife have a private pay care giver who comes in Monday, Wednesday, Friday for approximately four hours. Care giver cooks, cleans, does laundry and provides transportation.   Social Worker assessment / plan:  Patient lives with his spouse. At baseline, he ambulates with a walker or a cane.  His wife assists him with ADLs as needed. Family is interested in SNF, however they were under the impression that due to his renal failure he would not survive this current illness. LCSW discussed that patient participated in SNF evaluation and was recommended for SNF for short term rehab.   Employment status:  Retired Nurse, adult PT Recommendations:  [place] / Referral to community resources:  [place]  Patient/Family's Response to care:  Family is agreeable to SNF.  Patient/Family's Understanding of and Emotional Response to Diagnosis, Current Treatment, and Prognosis:  Family understands patient's diagnosis, treatment and  prognosis. Patient's wife became tearful and LCSW provided supportive counseling and encouragement.   Emotional Assessment Appearance:  Appears stated age Attitude/Demeanor/Rapport:    Affect (typically observed):  Accepting Orientation:  Oriented to Self Alcohol / Substance use:  Not Applicable Psych involvement (Current and /or in the community):  No (Comment)  Discharge Needs  Concerns to be addressed:  Discharge Planning Concerns Readmission within the last 30 days:  No Current discharge risk:  None Barriers to Discharge:  Insurance Authorization   Ihor Gully, LCSW 06/27/2017, 10:41 AM

## 2017-06-27 NOTE — Progress Notes (Signed)
Pt refused to take 1000 meds.

## 2017-06-27 NOTE — Progress Notes (Signed)
PT refused dinner per day shift nurse on 06/26/17. Offered PT oral fluids and PT refused. Continue to monitor.

## 2017-06-27 NOTE — Progress Notes (Signed)
Subjective: He remains confused.  He apparently refused his evening meal and did not drink any fluids.  Objective: Vital signs in last 24 hours: Temp:  [97.9 F (36.6 C)-98.6 F (37 C)] 98.2 F (36.8 C) (11/06 0606) Pulse Rate:  [87-104] 104 (11/06 0606) Resp:  [16-19] 16 (11/06 0606) BP: (129-157)/(81-91) 138/81 (11/06 0606) SpO2:  [93 %-96 %] 94 % (11/06 0606) Weight:  [87.2 kg (192 lb 3.9 oz)] 87.2 kg (192 lb 3.9 oz) (11/06 0606) Weight change: -0.4 kg (-14.1 oz) Last BM Date: 06/25/17  Intake/Output from previous day: 11/05 0701 - 11/06 0700 In: 723 [P.O.:720; I.V.:3] Out: -   PHYSICAL EXAM General appearance: Sleepy and confused but not agitated now Resp: rhonchi bilaterally Cardio: regular rate and rhythm, S1, S2 normal, no murmur, click, rub or gallop GI: soft, non-tender; bowel sounds normal; no masses,  no organomegaly Extremities: extremities normal, atraumatic, no cyanosis or edema Skin turgor fair  Lab Results:  Results for orders placed or performed during the hospital encounter of 06/24/17 (from the past 48 hour(s))  Glucose, capillary     Status: Abnormal   Collection Time: 06/26/17  6:57 AM  Result Value Ref Range   Glucose-Capillary 118 (H) 65 - 99 mg/dL   Comment 1 Notify RN    Comment 2 Document in Chart   Glucose, capillary     Status: Abnormal   Collection Time: 06/26/17  7:30 AM  Result Value Ref Range   Glucose-Capillary 123 (H) 65 - 99 mg/dL  Basic metabolic panel     Status: Abnormal   Collection Time: 06/26/17  9:39 AM  Result Value Ref Range   Sodium 152 (H) 135 - 145 mmol/L   Potassium 3.5 3.5 - 5.1 mmol/L   Chloride 113 (H) 101 - 111 mmol/L   CO2 30 22 - 32 mmol/L   Glucose, Bld 121 (H) 65 - 99 mg/dL   BUN 48 (H) 6 - 20 mg/dL   Creatinine, Ser 2.13 (H) 0.61 - 1.24 mg/dL   Calcium 10.0 8.9 - 10.3 mg/dL   GFR calc non Af Amer 26 (L) >60 mL/min   GFR calc Af Amer 30 (L) >60 mL/min    Comment: (NOTE) The eGFR has been calculated using  the CKD EPI equation. This calculation has not been validated in all clinical situations. eGFR's persistently <60 mL/min signify possible Chronic Kidney Disease.    Anion gap 9 5 - 15  Glucose, capillary     Status: Abnormal   Collection Time: 06/26/17 11:17 AM  Result Value Ref Range   Glucose-Capillary 102 (H) 65 - 99 mg/dL  Glucose, capillary     Status: Abnormal   Collection Time: 06/26/17  4:17 PM  Result Value Ref Range   Glucose-Capillary 110 (H) 65 - 99 mg/dL  Glucose, capillary     Status: None   Collection Time: 06/27/17  8:07 AM  Result Value Ref Range   Glucose-Capillary 94 65 - 99 mg/dL   Comment 1 Notify RN    Comment 2 Document in Chart     ABGS No results for input(s): PHART, PO2ART, TCO2, HCO3 in the last 72 hours.  Invalid input(s): PCO2 CULTURES No results found for this or any previous visit (from the past 240 hour(s)). Studies/Results: No results found.  Medications:  Prior to Admission:  Medications Prior to Admission  Medication Sig Dispense Refill Last Dose  . ALPRAZolam (XANAX) 0.5 MG tablet Take 0.5 mg by mouth 4 (four) times daily as needed  for anxiety.    unknown  . fenofibrate micronized (LOFIBRA) 134 MG capsule Take 134 mg by mouth every evening.    unknown  . furosemide (LASIX) 40 MG tablet TAKE 1 TABLET BY MOUTH IN THE MORNING AND TAKE 1/2 A TABLET IN THE EVENING (Patient taking differently: TAKE 1 TABLET BY MOUTH IN THE MORNING AND TAKE 1 TABLET IN THE EVENING) 135 tablet 3 unknown  . KLOR-CON M20 20 MEQ tablet TAKE 1 TABLET (20 MEQ TOTAL) BY MOUTH DAILY. 90 tablet 3 unknown  . metoprolol tartrate (LOPRESSOR) 25 MG tablet Take 25 mg by mouth every evening.    unknown  . NAMENDA XR 28 MG CP24 Take 28 mg by mouth at bedtime.   12 unknown at Unknown time  . omega-3 acid ethyl esters (LOVAZA) 1 G capsule Take 1 g by mouth 4 (four) times daily.    unknown  . pantoprazole (PROTONIX) 40 MG tablet Take 40 mg by mouth every evening.    unknown  .  potassium chloride SA (KLOR-CON M20) 20 MEQ tablet Take 20 mEq by mouth daily with lunch.    unknown at Unknown time  . Tamsulosin HCl (FLOMAX) 0.4 MG CAPS Take 0.4 mg by mouth at bedtime.    unknown  . acetaminophen (TYLENOL) 500 MG tablet Take 1-2 tablets (500-1,000 mg total) by mouth every 6 (six) hours as needed for mild pain, fever or headache. 30 tablet 0   . aspirin EC 81 MG tablet Take 81 mg by mouth every morning.    12/07/2016 at Unknown time  . lidocaine (LIDODERM) 5 % Place 1 patch onto the skin daily as needed (for pain). Pt applies to back.   Remove & Discard patch within 12 hours or as directed by MD    Past Week at Unknown time  . Multiple Vitamin (MULTIVITAMIN WITH MINERALS) TABS tablet Take 1 tablet by mouth daily with lunch.   12/06/2016 at Unknown time  . nitroGLYCERIN (NITROSTAT) 0.4 MG SL tablet Place 0.4 mg under the tongue every 5 (five) minutes as needed for chest pain.   Past Week at Unknown time  . oxyCODONE-acetaminophen (PERCOCET) 10-325 MG tablet Take 1 tablet by mouth every 6 (six) hours as needed for pain.   12/06/2016 at Unknown time  . promethazine (PHENERGAN) 12.5 MG tablet Take 1 tablet by mouth 4 (four) times daily as needed for nausea/vomiting.  5   . vitamin B-12 (CYANOCOBALAMIN) 1000 MCG tablet Take 1,000 mcg by mouth daily with lunch.    12/06/2016 at Unknown time   Scheduled: . aspirin EC  81 mg Oral q morning - 10a  . heparin  5,000 Units Subcutaneous Q8H  . memantine  28 mg Oral QHS  . multivitamin with minerals  1 tablet Oral Q lunch  . omega-3 acid ethyl esters  1 g Oral BID  . pantoprazole  40 mg Oral QPM  . sodium chloride flush  3 mL Intravenous Q12H  . tamsulosin  0.4 mg Oral QHS  . vitamin B-12  1,000 mcg Oral Q lunch   Continuous:  [id number] **OR** acetaminophen, bisacodyl, lidocaine, LORazepam, ondansetron **OR** ondansetron (ZOFRAN) IV, oxyCODONE-acetaminophen, senna-docusate, traMADol  Assesment: He was admitted with syncope.  He  had acute on chronic renal failure and his renal function was a little better yesterday but he is not taking in a lot of fluids now.  At baseline he has pretty severe dementia which is worse with him being in the hospital.  At baseline he has  a coronary disease which is stable.  He has severe chronic pain which is also stable.  It has been recommended that he go to a skilled care facility and we are working with the insurance company to get approval for that.  I discussed his situation with his daughter yesterday but I do not think she fully understood because she told the social worker that her father had renal failure and that I did not expect him to survive that. Principal Problem:   Syncope Active Problems:   CAD, NATIVE VESSEL   CKD (chronic kidney disease), stage IV (HCC)   Normocytic anemia   Dementia   Chronic diastolic CHF (congestive heart failure) (HCC)   Hypoxia   Anxiety disorder   Chronic back pain   Pressure injury of skin    Plan: Continue treatments.  Discuss with daughter again today    LOS: 1 day   Jonahtan Manseau L 06/27/2017, 8:37 AM

## 2017-06-27 NOTE — Care Management Important Message (Signed)
Important Message  Patient Details  Name: Devin Warner MRN: [id number] Date of Birth: 09-16-1926   Medicare Important Message Given:  Yes    Devin Warner, [name], RN 06/27/2017, 12:21 PM

## 2017-06-28 ENCOUNTER — Other Ambulatory Visit: Payer: Self-pay

## 2017-06-28 DIAGNOSIS — N184 Chronic kidney disease, stage 4 (severe): Secondary | ICD-10-CM | POA: Diagnosis not present

## 2017-06-28 DIAGNOSIS — M75101 Unspecified rotator cuff tear or rupture of right shoulder, not specified as traumatic: Secondary | ICD-10-CM | POA: Diagnosis not present

## 2017-06-28 DIAGNOSIS — I2510 Atherosclerotic heart disease of native coronary artery without angina pectoris: Secondary | ICD-10-CM | POA: Diagnosis not present

## 2017-06-28 DIAGNOSIS — F419 Anxiety disorder, unspecified: Secondary | ICD-10-CM | POA: Diagnosis not present

## 2017-06-28 DIAGNOSIS — R0902 Hypoxemia: Secondary | ICD-10-CM | POA: Diagnosis not present

## 2017-06-28 DIAGNOSIS — I509 Heart failure, unspecified: Secondary | ICD-10-CM | POA: Diagnosis not present

## 2017-06-28 DIAGNOSIS — R55 Syncope and collapse: Secondary | ICD-10-CM | POA: Diagnosis not present

## 2017-06-28 DIAGNOSIS — M1710 Unilateral primary osteoarthritis, unspecified knee: Secondary | ICD-10-CM | POA: Diagnosis not present

## 2017-06-28 DIAGNOSIS — M1712 Unilateral primary osteoarthritis, left knee: Secondary | ICD-10-CM | POA: Diagnosis not present

## 2017-06-28 DIAGNOSIS — M25561 Pain in right knee: Secondary | ICD-10-CM | POA: Diagnosis not present

## 2017-06-28 DIAGNOSIS — R1310 Dysphagia, unspecified: Secondary | ICD-10-CM | POA: Diagnosis not present

## 2017-06-28 DIAGNOSIS — M25469 Effusion, unspecified knee: Secondary | ICD-10-CM | POA: Diagnosis not present

## 2017-06-28 DIAGNOSIS — M6281 Muscle weakness (generalized): Secondary | ICD-10-CM | POA: Diagnosis not present

## 2017-06-28 DIAGNOSIS — I5032 Chronic diastolic (congestive) heart failure: Secondary | ICD-10-CM | POA: Diagnosis not present

## 2017-06-28 DIAGNOSIS — M7550 Bursitis of unspecified shoulder: Secondary | ICD-10-CM | POA: Diagnosis not present

## 2017-06-28 DIAGNOSIS — Z7401 Bed confinement status: Secondary | ICD-10-CM | POA: Diagnosis not present

## 2017-06-28 DIAGNOSIS — L8990 Pressure ulcer of unspecified site, unspecified stage: Secondary | ICD-10-CM | POA: Diagnosis not present

## 2017-06-28 DIAGNOSIS — F0390 Unspecified dementia without behavioral disturbance: Secondary | ICD-10-CM | POA: Diagnosis not present

## 2017-06-28 DIAGNOSIS — M545 Low back pain: Secondary | ICD-10-CM | POA: Diagnosis not present

## 2017-06-28 DIAGNOSIS — Z9181 History of falling: Secondary | ICD-10-CM | POA: Diagnosis not present

## 2017-06-28 DIAGNOSIS — D649 Anemia, unspecified: Secondary | ICD-10-CM | POA: Diagnosis not present

## 2017-06-28 DIAGNOSIS — I10 Essential (primary) hypertension: Secondary | ICD-10-CM | POA: Diagnosis not present

## 2017-06-28 LAB — GLUCOSE, CAPILLARY
GLUCOSE-CAPILLARY: 107 mg/dL — AB (ref 65–99)
Glucose-Capillary: 97 mg/dL (ref 65–99)

## 2017-06-28 MED ORDER — OXYCODONE-ACETAMINOPHEN 5-325 MG PO TABS: 1.0000 | ORAL_TABLET | Freq: Four times a day (QID) | ORAL | 0 refills | Status: AC | PRN

## 2017-06-28 NOTE — Clinical Social Work Placement (Signed)
   CLINICAL SOCIAL WORK PLACEMENT  NOTE  Date:  06/28/2017  Patient Details  Name: Devin Warner MRN: [id number] Date of Birth: Jan 07, 1927  Clinical Social Work is seeking post-discharge placement for this patient at the [place] level of care (*CSW will initial, date and re-position this form in  chart as items are completed):  Yes   Patient/family provided with [place] Work Department's list of facilities offering this level of care within the geographic area requested by the patient (or if unable, by the patient's family).  Yes   Patient/family informed of their freedom to choose among providers that offer the needed level of care, that participate in Medicare, Medicaid or managed care program needed by the patient, have an available bed and are willing to accept the patient.  Yes   Patient/family informed of [facility]'s ownership interest in [facility] and [facility], as well as of the fact that they are under no obligation to receive care at these facilities.  PASRR submitted to EDS on 06/27/17     PASRR number received on       Existing PASRR number confirmed on       FL2 transmitted to all facilities in geographic area requested by pt/family on 06/27/17     FL2 transmitted to all facilities within larger geographic area on       Patient informed that his/her managed care company has contracts with or will negotiate with certain facilities, including the following:            Patient/family informed of bed offers received.  Patient chooses bed at [facility]     Physician recommends and patient chooses bed at      Patient to be transferred to [facility] on  .  Patient to be transferred to facility by [facility]     Patient family notified on 06/28/17 of transfer.  Name of family member notified:  Sydnee Cabal, daughter     PHYSICIAN       Additional Comment:  Gerald Stabs at [facility] was informed that patient was  discharged and discharge clinicals were sent via Epic.  LCSW signing off.   _______________________________________________ Ihor Gully, LCSW 06/28/2017, 2:16 PM

## 2017-06-28 NOTE — Progress Notes (Signed)
Pt refused medications.

## 2017-06-28 NOTE — Plan of Care (Signed)
progressing 

## 2017-06-28 NOTE — Progress Notes (Signed)
Subjective: He continues to be more agitated at night.  No other new problems noted.  Objective: Vital signs in last 24 hours: Temp:  [98 F (36.7 C)-98.5 F (36.9 C)] 98 F (36.7 C) (11/06 1900) Pulse Rate:  [83-93] 83 (11/06 1900) Resp:  [16-17] 16 (11/06 1900) BP: (113-140)/(59-85) 118/69 (11/06 1900) SpO2:  [92 %-96 %] 92 % (11/06 2058) Weight:  [79.1 kg (174 lb 6.1 oz)] 79.1 kg (174 lb 6.1 oz) (11/07 0100) Weight change: -8.1 kg (-13.7 oz) Last BM Date: 06/25/17  Intake/Output from previous day: 11/06 0701 - 11/07 0700 In: 240 [P.O.:240] Out: 350 [Urine:350]  PHYSICAL EXAM General appearance: alert and mild distress Resp: clear to auscultation bilaterally Cardio: regular rate and rhythm, S1, S2 normal, no murmur, click, rub or gallop GI: soft, non-tender; bowel sounds normal; no masses,  no organomegaly Extremities: extremities normal, atraumatic, no cyanosis or edema Very confused  Lab Results:  Results for orders placed or performed during the hospital encounter of 06/24/17 (from the past 48 hour(s))  Basic metabolic panel     Status: Abnormal   Collection Time: 06/26/17  9:39 AM  Result Value Ref Range   Sodium 152 (H) 135 - 145 mmol/L   Potassium 3.5 3.5 - 5.1 mmol/L   Chloride 113 (H) 101 - 111 mmol/L   CO2 30 22 - 32 mmol/L   Glucose, Bld 121 (H) 65 - 99 mg/dL   BUN 48 (H) 6 - 20 mg/dL   Creatinine, Ser 2.13 (H) 0.61 - 1.24 mg/dL   Calcium 10.0 8.9 - 10.3 mg/dL   GFR calc non Af Amer 26 (L) >60 mL/min   GFR calc Af Amer 30 (L) >60 mL/min    Comment: (NOTE) The eGFR has been calculated using the CKD EPI equation. This calculation has not been validated in all clinical situations. eGFR's persistently <60 mL/min signify possible Chronic Kidney Disease.    Anion gap 9 5 - 15  Glucose, capillary     Status: Abnormal   Collection Time: 06/26/17 11:17 AM  Result Value Ref Range   Glucose-Capillary 102 (H) 65 - 99 mg/dL  Glucose, capillary     Status:  Abnormal   Collection Time: 06/26/17  4:17 PM  Result Value Ref Range   Glucose-Capillary 110 (H) 65 - 99 mg/dL  Glucose, capillary     Status: None   Collection Time: 06/27/17  8:07 AM  Result Value Ref Range   Glucose-Capillary 94 65 - 99 mg/dL   Comment 1 Notify RN    Comment 2 Document in Chart   Glucose, capillary     Status: None   Collection Time: 06/27/17 11:41 AM  Result Value Ref Range   Glucose-Capillary 87 65 - 99 mg/dL   Comment 1 Notify RN    Comment 2 Document in Chart   Glucose, capillary     Status: Abnormal   Collection Time: 06/27/17  4:21 PM  Result Value Ref Range   Glucose-Capillary 148 (H) 65 - 99 mg/dL  Glucose, capillary     Status: Abnormal   Collection Time: 06/27/17  9:44 PM  Result Value Ref Range   Glucose-Capillary 149 (H) 65 - 99 mg/dL   Comment 1 Notify RN    Comment 2 Document in Chart   Glucose, capillary     Status: None   Collection Time: 06/28/17  8:07 AM  Result Value Ref Range   Glucose-Capillary 97 65 - 99 mg/dL   Comment 1 Notify RN  Comment 2 Document in Chart     ABGS No results for input(s): PHART, PO2ART, TCO2, HCO3 in the last 72 hours.  Invalid input(s): PCO2 CULTURES No results found for this or any previous visit (from the past 240 hour(s)). Studies/Results: No results found.  Medications:  Prior to Admission:  Medications Prior to Admission  Medication Sig Dispense Refill Last Dose  . ALPRAZolam (XANAX) 0.5 MG tablet Take 0.5 mg by mouth 4 (four) times daily as needed for anxiety.    unknown  . fenofibrate micronized (LOFIBRA) 134 MG capsule Take 134 mg by mouth every evening.    unknown  . furosemide (LASIX) 40 MG tablet TAKE 1 TABLET BY MOUTH IN THE MORNING AND TAKE 1/2 A TABLET IN THE EVENING (Patient taking differently: TAKE 1 TABLET BY MOUTH IN THE MORNING AND TAKE 1 TABLET IN THE EVENING) 135 tablet 3 unknown  . KLOR-CON M20 20 MEQ tablet TAKE 1 TABLET (20 MEQ TOTAL) BY MOUTH DAILY. 90 tablet 3 unknown  .  metoprolol tartrate (LOPRESSOR) 25 MG tablet Take 25 mg by mouth every evening.    unknown  . NAMENDA XR 28 MG CP24 Take 28 mg by mouth at bedtime.   12 unknown at Unknown time  . omega-3 acid ethyl esters (LOVAZA) 1 G capsule Take 1 g by mouth 4 (four) times daily.    unknown  . pantoprazole (PROTONIX) 40 MG tablet Take 40 mg by mouth every evening.    unknown  . potassium chloride SA (KLOR-CON M20) 20 MEQ tablet Take 20 mEq by mouth daily with lunch.    unknown at Unknown time  . Tamsulosin HCl (FLOMAX) 0.4 MG CAPS Take 0.4 mg by mouth at bedtime.    unknown  . acetaminophen (TYLENOL) 500 MG tablet Take 1-2 tablets (500-1,000 mg total) by mouth every 6 (six) hours as needed for mild pain, fever or headache. 30 tablet 0   . aspirin EC 81 MG tablet Take 81 mg by mouth every morning.    12/07/2016 at Unknown time  . lidocaine (LIDODERM) 5 % Place 1 patch onto the skin daily as needed (for pain). Pt applies to back.   Remove & Discard patch within 12 hours or as directed by MD    Past Week at Unknown time  . Multiple Vitamin (MULTIVITAMIN WITH MINERALS) TABS tablet Take 1 tablet by mouth daily with lunch.   12/06/2016 at Unknown time  . nitroGLYCERIN (NITROSTAT) 0.4 MG SL tablet Place 0.4 mg under the tongue every 5 (five) minutes as needed for chest pain.   Past Week at Unknown time  . oxyCODONE-acetaminophen (PERCOCET) 10-325 MG tablet Take 1 tablet by mouth every 6 (six) hours as needed for pain.   12/06/2016 at Unknown time  . promethazine (PHENERGAN) 12.5 MG tablet Take 1 tablet by mouth 4 (four) times daily as needed for nausea/vomiting.  5   . vitamin B-12 (CYANOCOBALAMIN) 1000 MCG tablet Take 1,000 mcg by mouth daily with lunch.    12/06/2016 at Unknown time   Scheduled: . aspirin EC  81 mg Oral q morning - 10a  . heparin  5,000 Units Subcutaneous Q8H  . memantine  28 mg Oral QHS  . multivitamin with minerals  1 tablet Oral Q lunch  . omega-3 acid ethyl esters  1 g Oral BID  . pantoprazole   40 mg Oral QPM  . sodium chloride flush  3 mL Intravenous Q12H  . tamsulosin  0.4 mg Oral QHS  . vitamin  B-12  1,000 mcg Oral Q lunch   Continuous:  QIW:[id number] **OR** acetaminophen, bisacodyl, lidocaine, LORazepam, ondansetron **OR** ondansetron (ZOFRAN) IV, oxyCODONE-acetaminophen, senna-docusate, traMADol  Assesment: He was admitted with syncope.  He has not had any further episodes.  He has dementia and he has been very agitated.  He has coronary disease at baseline but no symptoms now.  He has chronic diastolic heart failure at baseline and that looks stable.  He has chronic kidney disease and his renal function had improved somewhat from admission.  He has chronic pain which is stable and chronic anxiety which is worse since she is in the hospital Principal Problem:   Syncope Active Problems:   CAD, NATIVE VESSEL   CKD (chronic kidney disease), stage IV (HCC)   Normocytic anemia   Dementia   Chronic diastolic CHF (congestive heart failure) (HCC)   Hypoxia   Anxiety disorder   Chronic back pain   Pressure injury of skin    Plan: Potential transfer to skilled care facility today.  Discussed at length with his daughter by phone.    LOS: 2 days   Devin Warner L 06/28/2017, 8:35 AM

## 2017-06-28 NOTE — Discharge Summary (Signed)
Physician Discharge Summary  Patient ID: Devin Warner MRN: [id number] DOB/AGE: [birth year] 81 y.o. Primary Care Physician:Devin Warner, Devin Miller, MD Admit date: 06/24/2017 Discharge date: 06/28/2017    Discharge Diagnoses:   Principal Problem:   Syncope Active Problems:   CAD, NATIVE VESSEL   CKD (chronic kidney disease), stage IV (HCC)   Normocytic anemia   Dementia   Chronic diastolic CHF (congestive heart failure) (HCC)   Hypoxia   Anxiety disorder   Chronic back pain   Pressure injury of skin   Allergies as of 06/28/2017      Reactions   Morphine Hives, Anxiety      Medication List    STOP taking these medications   oxyCODONE-acetaminophen 10-325 MG tablet Commonly known as:  PERCOCET Replaced by:  oxyCODONE-acetaminophen 5-325 MG tablet     TAKE these medications   acetaminophen 500 MG tablet Commonly known as:  TYLENOL Take 1-2 tablets (500-1,000 mg total) by mouth every 6 (six) hours as needed for mild pain, fever or headache.   ALPRAZolam 0.5 MG tablet Commonly known as:  XANAX Take 0.5 mg by mouth 4 (four) times daily as needed for anxiety.   aspirin EC 81 MG tablet Take 81 mg by mouth every morning.   fenofibrate micronized 134 MG capsule Commonly known as:  LOFIBRA Take 134 mg by mouth every evening.   FLOMAX 0.4 MG Caps capsule Generic drug:  tamsulosin Take 0.4 mg by mouth at bedtime.   furosemide 40 MG tablet Commonly known as:  LASIX TAKE 1 TABLET BY MOUTH IN THE MORNING AND TAKE 1/2 A TABLET IN THE EVENING What changed:  See the new instructions.   KLOR-CON M20 20 MEQ tablet Generic drug:  potassium chloride SA Take 20 mEq by mouth daily with lunch.   KLOR-CON M20 20 MEQ tablet Generic drug:  potassium chloride SA TAKE 1 TABLET (20 MEQ TOTAL) BY MOUTH DAILY.   LIDODERM 5 % Generic drug:  lidocaine Place 1 patch onto the skin daily as needed (for pain). Pt applies to back.   Remove & Discard patch within 12 hours or as directed by MD    LOVAZA 1 g capsule Generic drug:  omega-3 acid ethyl esters Take 1 g by mouth 4 (four) times daily.   metoprolol tartrate 25 MG tablet Commonly known as:  LOPRESSOR Take 25 mg by mouth every evening.   multivitamin with minerals Tabs tablet Take 1 tablet by mouth daily with lunch.   NAMENDA XR 28 MG Cp24 24 hr capsule Generic drug:  memantine Take 28 mg by mouth at bedtime.   nitroGLYCERIN 0.4 MG SL tablet Commonly known as:  NITROSTAT Place 0.4 mg under the tongue every 5 (five) minutes as needed for chest pain.   oxyCODONE-acetaminophen 5-325 MG tablet Commonly known as:  PERCOCET/ROXICET Take 1 tablet every 6 (six) hours as needed by mouth for severe pain. Replaces:  oxyCODONE-acetaminophen 10-325 MG tablet   pantoprazole 40 MG tablet Commonly known as:  PROTONIX Take 40 mg by mouth every evening.   promethazine 12.5 MG tablet Commonly known as:  PHENERGAN Take 1 tablet by mouth 4 (four) times daily as needed for nausea/vomiting.   vitamin B-12 1000 MCG tablet Commonly known as:  CYANOCOBALAMIN Take 1,000 mcg by mouth daily with lunch.       Discharged Condition: Unchanged    Consults: None  Significant Diagnostic Studies: Dg Chest 1 View  Result Date: 06/24/2017 CLINICAL DATA:  Tripped and fell on walker. Found down on  RIGHT side. Head injury. EXAM: CHEST 1 VIEW COMPARISON:  Chest radiograph May 03, 2017 FINDINGS: Cardiac silhouette is mildly enlarged and unchanged. Tortuous calcified aorta. Status post median sternotomy for CABG. Low inspiratory examination with crowded vascular markings, mild vascular engorgement. Chronically elevated RIGHT hemidiaphragm with RIGHT lung base strandy densities. No pleural effusion. No pneumothorax. Osteopenia. Surgical clips in the included right abdomen compatible with cholecystectomy. IMPRESSION: Stable cardiomegaly with pulmonary vascular congestion. Bibasilar atelectasis. Electronically Signed   By: Elon Alas  M.D.   On: 06/24/2017 03:30   Ct Head Wo Contrast  Result Date: 06/24/2017 CLINICAL DATA:  58-year-old male with fall. EXAM: CT HEAD WITHOUT CONTRAST CT CERVICAL SPINE WITHOUT CONTRAST TECHNIQUE: Multidetector CT imaging of the head and cervical spine was performed following the standard protocol without intravenous contrast. Multiplanar CT image reconstructions of the cervical spine were also generated. COMPARISON:  Head CT dated 12/07/2016 FINDINGS: CT HEAD FINDINGS Brain: There is moderate age-related atrophy and chronic microvascular ischemic changes. There is no acute intracranial hemorrhage. No mass effect or midline shift noted. No extra-axial fluid collection. Vascular: No hyperdense vessel or unexpected calcification. Skull: Normal. Negative for fracture or focal lesion. Sinuses/Orbits: No acute finding. Other: None CT CERVICAL SPINE FINDINGS Alignment: No acute subluxation. There is grade 1 C3-C4 anterolisthesis. Skull base and vertebrae: No acute fracture. No primary bone lesion or focal pathologic process. Soft tissues and spinal canal: No prevertebral fluid or swelling. No visible canal hematoma. Disc levels: Multilevel degenerative changes most prominent at C5-C6 where there is endplate irregularity and disc space narrowing. Multilevel facet hypertrophy and degenerative changes noted. Upper chest: Negative. Other: Right thyroid nodule measures approximately 3 cm. Further evaluation with ultrasound recommended. Bilateral carotid bulb atherosclerotic plaques noted. IMPRESSION: 1. No acute intracranial hemorrhage. 2. Age-related atrophy and chronic microvascular ischemic changes. 3. No acute/traumatic cervical spine pathology. Multilevel degenerative changes. 4. Bilateral carotid bulb calcified plaques and right thyroid gland nodule. Electronically Signed   By: Anner Crete M.D.   On: 06/24/2017 03:41   Ct Cervical Spine Wo Contrast  Result Date: 06/24/2017 CLINICAL DATA:  23-year-old male with  fall. EXAM: CT HEAD WITHOUT CONTRAST CT CERVICAL SPINE WITHOUT CONTRAST TECHNIQUE: Multidetector CT imaging of the head and cervical spine was performed following the standard protocol without intravenous contrast. Multiplanar CT image reconstructions of the cervical spine were also generated. COMPARISON:  Head CT dated 12/07/2016 FINDINGS: CT HEAD FINDINGS Brain: There is moderate age-related atrophy and chronic microvascular ischemic changes. There is no acute intracranial hemorrhage. No mass effect or midline shift noted. No extra-axial fluid collection. Vascular: No hyperdense vessel or unexpected calcification. Skull: Normal. Negative for fracture or focal lesion. Sinuses/Orbits: No acute finding. Other: None CT CERVICAL SPINE FINDINGS Alignment: No acute subluxation. There is grade 1 C3-C4 anterolisthesis. Skull base and vertebrae: No acute fracture. No primary bone lesion or focal pathologic process. Soft tissues and spinal canal: No prevertebral fluid or swelling. No visible canal hematoma. Disc levels: Multilevel degenerative changes most prominent at C5-C6 where there is endplate irregularity and disc space narrowing. Multilevel facet hypertrophy and degenerative changes noted. Upper chest: Negative. Other: Right thyroid nodule measures approximately 3 cm. Further evaluation with ultrasound recommended. Bilateral carotid bulb atherosclerotic plaques noted. IMPRESSION: 1. No acute intracranial hemorrhage. 2. Age-related atrophy and chronic microvascular ischemic changes. 3. No acute/traumatic cervical spine pathology. Multilevel degenerative changes. 4. Bilateral carotid bulb calcified plaques and right thyroid gland nodule. Electronically Signed   By: Anner Crete M.D.   On:  06/24/2017 03:41    Lab Results: Basic Metabolic Panel: Recent Labs    06/26/17 0939  NA 152*  K 3.5  CL 113*  CO2 30  GLUCOSE 121*  BUN 48*  CREATININE 2.13*  CALCIUM 10.0   Liver Function Tests: No results for  input(s): AST, ALT, ALKPHOS, BILITOT, PROT, ALBUMIN in the last 72 hours.   CBC: No results for input(s): WBC, NEUTROABS, HGB, HCT, MCV, PLT in the last 72 hours.  No results found for this or any previous visit (from the past 240 hour(s)).   Hospital Course: This is an 83 year old who had a syncopal episode at home.  His wife found him in the floor.  It is not clear how long he was down.  He was brought to the emergency room where he was confused and sleepy.  He was felt to be mildly dehydrated and he received some IV fluids.  He continued to have confusion but has dementia at baseline.  He was evaluated by physical therapy and it was recommended that he go to a skilled care facility.  His daughter who has power of attorney agrees and his insurance company has approved skilled care at least briefly.  Discharge Exam: Blood pressure 118/69, pulse 83, temperature 98 F (36.7 C), temperature source Axillary, resp. rate 16, height 6' (1.829 m), weight 79.1 kg (174 lb 6.1 oz), SpO2 92 %. He is confused.  His chest is clear.  His heart is regular.  His abdomen is soft.  Disposition: To skilled care facility.  He will need PT OT and speech.  He will be on a heart healthy diet    Contact information for after-discharge care    [facility] .   Service:  Skilled Nursing Contact information: [address] [place] [phone]              Signed: Alonza Bogus   06/28/2017, 8:45 AM

## 2017-06-28 NOTE — Progress Notes (Signed)
Called report to Raelee at the [facility] in [place].  Removed pts IV-clean, dry, and intact. Removed condemn catheter. Called daughter [name] to let her know that [facility] was coming to transfer pt.

## 2017-06-29 DIAGNOSIS — D649 Anemia, unspecified: Secondary | ICD-10-CM | POA: Diagnosis not present

## 2017-06-29 DIAGNOSIS — N184 Chronic kidney disease, stage 4 (severe): Secondary | ICD-10-CM | POA: Diagnosis not present

## 2017-06-29 DIAGNOSIS — R55 Syncope and collapse: Secondary | ICD-10-CM | POA: Diagnosis not present

## 2017-06-29 DIAGNOSIS — F0390 Unspecified dementia without behavioral disturbance: Secondary | ICD-10-CM | POA: Diagnosis not present

## 2017-07-05 NOTE — Discharge Summary (Signed)
NAME:  Devin Warner, Devin Warner                 ACCOUNT NO.:  [id number]  MEDICAL RECORD NO.:  [id number]  LOCATION:  [facility]                         FACILITY:  [facility]  PHYSICIAN:  Barbar Brede L. Luan Pulling, M.D.DATE OF BIRTH:  [birth year]  DATE OF ADMISSION:  06/24/2017 DATE OF DISCHARGE:  11/07/2018LH                              DISCHARGE SUMMARY   ADDENDUM:  DISCHARGE DIAGNOSES: 1. Dementia with confusion or delirium. 2. Acute kidney injury with chronic kidney disease stage 4. 3. Stage II pressure injury, right buttock, present on admission.     Herman Fiero L. Luan Pulling, M.D.     [facility]/MED[facility]  D:  07/04/2017  T:  07/05/2017  Job:  [id number]

## 2017-07-22 DEATH — deceased
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
## 2017-08-25 ENCOUNTER — Ambulatory Visit: Payer: PPO | Admitting: Cardiovascular Disease
